# Patient Record
Sex: Female | Born: 1995 | Race: White | Hispanic: No | Marital: Single | Smoking: Current every day smoker
Health system: Southern US, Community
[De-identification: ages and names within clinical notes are randomized; demographics above are authoritative.]

## PROBLEM LIST (undated history)

## (undated) ENCOUNTER — Inpatient Hospital Stay (HOSPITAL_COMMUNITY): Payer: Medicaid Other

## (undated) DIAGNOSIS — F329 Major depressive disorder, single episode, unspecified: Secondary | ICD-10-CM

## (undated) DIAGNOSIS — F909 Attention-deficit hyperactivity disorder, unspecified type: Secondary | ICD-10-CM

## (undated) DIAGNOSIS — F319 Bipolar disorder, unspecified: Secondary | ICD-10-CM

## (undated) DIAGNOSIS — K219 Gastro-esophageal reflux disease without esophagitis: Secondary | ICD-10-CM

## (undated) DIAGNOSIS — J45909 Unspecified asthma, uncomplicated: Secondary | ICD-10-CM

## (undated) DIAGNOSIS — G43909 Migraine, unspecified, not intractable, without status migrainosus: Secondary | ICD-10-CM

## (undated) DIAGNOSIS — F32A Depression, unspecified: Secondary | ICD-10-CM

## (undated) HISTORY — PX: WISDOM TOOTH EXTRACTION: SHX21

## (undated) HISTORY — PX: TONSILLECTOMY: SUR1361

---
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
## 2018-01-27 ENCOUNTER — Emergency Department (HOSPITAL_COMMUNITY)
Admission: EM | Admit: 2018-01-27 | Discharge: 2018-01-28 | Disposition: A | Discharge status: Home / Self Care | Payer: Medicaid Other | Attending: Emergency Medicine | Admitting: Emergency Medicine

## 2018-01-27 ENCOUNTER — Other Ambulatory Visit: Payer: Self-pay

## 2018-01-27 ENCOUNTER — Encounter (HOSPITAL_COMMUNITY): Payer: Self-pay | Admitting: Emergency Medicine

## 2018-01-27 ENCOUNTER — Emergency Department (HOSPITAL_COMMUNITY): Payer: Medicaid Other

## 2018-01-27 DIAGNOSIS — R51 Headache: Secondary | ICD-10-CM | POA: Diagnosis not present

## 2018-01-27 DIAGNOSIS — R0789 Other chest pain: Secondary | ICD-10-CM | POA: Insufficient documentation

## 2018-01-27 DIAGNOSIS — Z5321 Procedure and treatment not carried out due to patient leaving prior to being seen by health care provider: Secondary | ICD-10-CM | POA: Diagnosis not present

## 2018-01-27 DIAGNOSIS — R112 Nausea with vomiting, unspecified: Secondary | ICD-10-CM | POA: Diagnosis not present

## 2018-01-27 DIAGNOSIS — R300 Dysuria: Secondary | ICD-10-CM | POA: Diagnosis not present

## 2018-01-27 DIAGNOSIS — R109 Unspecified abdominal pain: Secondary | ICD-10-CM | POA: Insufficient documentation

## 2018-01-27 HISTORY — DX: Attention-deficit hyperactivity disorder, unspecified type: F90.9

## 2018-01-27 HISTORY — DX: Major depressive disorder, single episode, unspecified: F32.9

## 2018-01-27 HISTORY — DX: Gastro-esophageal reflux disease without esophagitis: K21.9

## 2018-01-27 HISTORY — DX: Depression, unspecified: F32.A

## 2018-01-27 HISTORY — DX: Unspecified asthma, uncomplicated: J45.909

## 2018-01-27 LAB — I-STAT BETA HCG BLOOD, ED (MC, WL, AP ONLY)

## 2018-01-27 LAB — COMPREHENSIVE METABOLIC PANEL
ALK PHOS: 56 U/L (ref 38–126)
ALT: 16 U/L (ref 0–44)
AST: 15 U/L (ref 15–41)
Albumin: 3.9 g/dL (ref 3.5–5.0)
Anion gap: 8 (ref 5–15)
BUN: 9 mg/dL (ref 6–20)
CALCIUM: 8.8 mg/dL — AB (ref 8.9–10.3)
CO2: 23 mmol/L (ref 22–32)
CREATININE: 0.77 mg/dL (ref 0.44–1.00)
Chloride: 107 mmol/L (ref 98–111)
GFR calc non Af Amer: >60 mL/min (ref >60)
GLUCOSE: 90 mg/dL (ref 70–99)
Potassium: 3.8 mmol/L (ref 3.5–5.1)
SODIUM: 138 mmol/L (ref 135–145)
Total Bilirubin: 0.5 mg/dL (ref 0.3–1.2)
Total Protein: 6.5 g/dL (ref 6.5–8.1)

## 2018-01-27 LAB — I-STAT TROPONIN, ED: Troponin i, poc: 0.01 ng/mL (ref 0.00–0.08)

## 2018-01-27 LAB — LIPASE, BLOOD: Lipase: 39 U/L (ref 11–51)

## 2018-01-27 LAB — CBC
HCT: 44.2 % (ref 36.0–46.0)
Hemoglobin: 14.3 g/dL (ref 12.0–15.0)
MCH: 31.4 pg (ref 26.0–34.0)
MCHC: 32.4 g/dL (ref 30.0–36.0)
MCV: 96.9 fL (ref 78.0–100.0)
PLATELETS: 288 10*3/uL (ref 150–400)
RBC: 4.56 MIL/uL (ref 3.87–5.11)
RDW: 13.1 % (ref 11.5–15.5)
WBC: 9.1 10*3/uL (ref 4.0–10.5)

## 2018-01-27 NOTE — ED Triage Notes (Signed)
Pt to ED with multiple complaints.  Chest pain for 2-3 months, Mid abd pain x's 3 days, burning with urination x's 1 week, migraine headache x's 3 days, nausea with vomiting x's 1 day

## 2018-01-28 ENCOUNTER — Emergency Department
Admission: EM | Admit: 2018-01-28 | Discharge: 2018-01-28 | Disposition: A | Discharge status: Home / Self Care | Payer: Medicaid Other | Attending: Emergency Medicine | Admitting: Emergency Medicine

## 2018-01-28 ENCOUNTER — Other Ambulatory Visit: Payer: Self-pay

## 2018-01-28 ENCOUNTER — Encounter: Payer: Self-pay | Admitting: Emergency Medicine

## 2018-01-28 DIAGNOSIS — R197 Diarrhea, unspecified: Secondary | ICD-10-CM | POA: Insufficient documentation

## 2018-01-28 DIAGNOSIS — R112 Nausea with vomiting, unspecified: Secondary | ICD-10-CM | POA: Diagnosis not present

## 2018-01-28 DIAGNOSIS — N898 Other specified noninflammatory disorders of vagina: Secondary | ICD-10-CM | POA: Diagnosis not present

## 2018-01-28 DIAGNOSIS — J45909 Unspecified asthma, uncomplicated: Secondary | ICD-10-CM | POA: Insufficient documentation

## 2018-01-28 DIAGNOSIS — K529 Noninfective gastroenteritis and colitis, unspecified: Secondary | ICD-10-CM

## 2018-01-28 DIAGNOSIS — M545 Low back pain: Secondary | ICD-10-CM | POA: Insufficient documentation

## 2018-01-28 DIAGNOSIS — R109 Unspecified abdominal pain: Secondary | ICD-10-CM | POA: Diagnosis present

## 2018-01-28 DIAGNOSIS — F17210 Nicotine dependence, cigarettes, uncomplicated: Secondary | ICD-10-CM | POA: Insufficient documentation

## 2018-01-28 DIAGNOSIS — R309 Painful micturition, unspecified: Secondary | ICD-10-CM | POA: Insufficient documentation

## 2018-01-28 HISTORY — DX: Bipolar disorder, unspecified: F31.9

## 2018-01-28 HISTORY — DX: Migraine, unspecified, not intractable, without status migrainosus: G43.909

## 2018-01-28 LAB — URINALYSIS, COMPLETE (UACMP) WITH MICROSCOPIC
BILIRUBIN URINE: NEGATIVE
Bacteria, UA: NONE SEEN
GLUCOSE, UA: NEGATIVE mg/dL
Hgb urine dipstick: NEGATIVE
Ketones, ur: NEGATIVE mg/dL
LEUKOCYTES UA: NEGATIVE
Nitrite: NEGATIVE
PH: 7 (ref 5.0–8.0)
Protein, ur: NEGATIVE mg/dL
SPECIFIC GRAVITY, URINE: 1.012 (ref 1.005–1.030)

## 2018-01-28 LAB — COMPREHENSIVE METABOLIC PANEL
ALK PHOS: 62 U/L (ref 38–126)
ALT: 17 U/L (ref 0–44)
AST: 18 U/L (ref 15–41)
Albumin: 4.2 g/dL (ref 3.5–5.0)
Anion gap: 6 (ref 5–15)
BILIRUBIN TOTAL: 0.6 mg/dL (ref 0.3–1.2)
BUN: 9 mg/dL (ref 6–20)
CALCIUM: 9 mg/dL (ref 8.9–10.3)
CHLORIDE: 108 mmol/L (ref 98–111)
CO2: 24 mmol/L (ref 22–32)
CREATININE: 0.75 mg/dL (ref 0.44–1.00)
Glucose, Bld: 115 mg/dL — ABNORMAL HIGH (ref 70–99)
Potassium: 3.8 mmol/L (ref 3.5–5.1)
Sodium: 138 mmol/L (ref 135–145)
TOTAL PROTEIN: 6.8 g/dL (ref 6.5–8.1)

## 2018-01-28 LAB — CBC
HCT: 41.8 % (ref 35.0–47.0)
Hemoglobin: 14.6 g/dL (ref 12.0–16.0)
MCH: 32.9 pg (ref 26.0–34.0)
MCHC: 34.8 g/dL (ref 32.0–36.0)
MCV: 94.4 fL (ref 80.0–100.0)
PLATELETS: 277 10*3/uL (ref 150–440)
RBC: 4.43 MIL/uL (ref 3.80–5.20)
RDW: 13.7 % (ref 11.5–14.5)
WBC: 9.2 10*3/uL (ref 3.6–11.0)

## 2018-01-28 LAB — URINALYSIS, ROUTINE W REFLEX MICROSCOPIC
Bilirubin Urine: NEGATIVE
GLUCOSE, UA: NEGATIVE mg/dL
HGB URINE DIPSTICK: NEGATIVE
Ketones, ur: NEGATIVE mg/dL
NITRITE: NEGATIVE
Protein, ur: NEGATIVE mg/dL
SPECIFIC GRAVITY, URINE: 1.025 (ref 1.005–1.030)
pH: 6 (ref 5.0–8.0)

## 2018-01-28 LAB — POCT PREGNANCY, URINE: Preg Test, Ur: NEGATIVE

## 2018-01-28 LAB — WET PREP, GENITAL
SPERM: NONE SEEN
TRICH WET PREP: NONE SEEN
YEAST WET PREP: NONE SEEN

## 2018-01-28 LAB — LIPASE, BLOOD: LIPASE: 28 U/L (ref 11–51)

## 2018-01-28 MED ORDER — ONDANSETRON 4 MG PO TBDP
4.0000 mg | ORAL_TABLET | Freq: Once | ORAL | Status: AC
  Administered 2018-01-28: 4 mg via ORAL

## 2018-01-28 MED ORDER — ONDANSETRON 4 MG PO TBDP: 4.0000 mg | ORAL_TABLET | Freq: Three times a day (TID) | ORAL | 0 refills | Status: DC | PRN

## 2018-01-28 MED ORDER — IBUPROFEN 400 MG PO TABS
600.0000 mg | ORAL_TABLET | Freq: Once | ORAL | Status: AC
  Administered 2018-01-28: 600 mg via ORAL

## 2018-01-28 MED ORDER — IBUPROFEN 600 MG PO TABS
ORAL_TABLET | ORAL | Status: AC
  Filled 2018-01-28: qty 1

## 2018-01-28 MED ORDER — ONDANSETRON 4 MG PO TBDP
ORAL_TABLET | ORAL | Status: AC
  Filled 2018-01-28: qty 1

## 2018-01-28 NOTE — ED Triage Notes (Addendum)
Pt presents to ED with lower abd pain for the past 3 days. Vomiting X5 and diarrhea. Burning with urination. Pain radiates around to her back.

## 2018-01-28 NOTE — ED Notes (Signed)
Patient stated that she is unable to sit in a room any longer, she has a 23 year old to get to bed.  She stated that she needed to sign herself out.  Patient exited ED at this time.

## 2018-01-28 NOTE — ED Provider Notes (Signed)
[facility] Emergency Department Provider Note  ____________________________________________  Time seen: Approximately 9:19 PM  I have reviewed the triage vital signs and the nursing notes.   HISTORY  Chief Complaint Abdominal Pain   HPI Emilio Aspenshley N Diesel is a 22 y.o. female with a history of bipolar disorder, ADHD, asthma, GERD, migraine headaches who presents for evaluation of abdominal pain vomiting and diarrhea.  Patient reports that the abdominal pain started 3 days ago, sharp and stabbing, constant, currently 6 out of 10.  She reports chronic lower back pain however that has been worse since the abdominal pain started.  Patient reports that yesterday she started to have vomiting and diarrhea.  She reports 5 episodes of watery diarrhea day.  She reports vomiting every time she eats.  She has had chills but no fever, she reports burning with urination and a vaginal discharge which according to her "it is her normal".  She is requesting to be checked for STDs.  She denies any prior abdominal surgeries.  No melena, hematemesis, hematochezia.  Past Medical History:  Diagnosis Date  . ADHD   . Asthma   . Bipolar disorder (HCC)   . Depression   . GERD (gastroesophageal reflux disease)   . Migraine     Past Surgical History:  Procedure Laterality Date  . TONSILLECTOMY    . WISDOM TOOTH EXTRACTION      Prior to Admission medications   Medication Sig Start Date End Date Taking? Authorizing Provider  ondansetron (ZOFRAN ODT) 4 MG disintegrating tablet Take 1 tablet (4 mg total) by mouth every 8 (eight) hours as needed for nausea or vomiting. 01/28/18   Nita SickleVeronese, [place], MD    Allergies Fluconazole  FH Bipolar disorder Father    Schizophrenia Father    Bipolar disorder Maternal Grandmother    Hypertension Maternal Grandmother    Bipolar disorder Mother    Diabetes Mother    Seizures Mother    Diabetes Paternal Grandmother      Hypertension Paternal Grandmother    Bipolar disorder Sister    Asthma Son      Social History Social History   Tobacco Use  . Smoking status: Current Every Day Smoker    Packs/day: 1.00    Types: Cigarettes  . Smokeless tobacco: Never Used  Substance Use Topics  . Alcohol use: Never    Frequency: Never  . Drug use: Never    Review of Systems  Constitutional: Negative for fever. + chills Eyes: Negative for visual changes. ENT: Negative for sore throat. Neck: No neck pain  Cardiovascular: Negative for chest pain. Respiratory: Negative for shortness of breath. Gastrointestinal: + abdominal pain, vomiting and diarrhea. Genitourinary: + dysuria and vaginal discharge Musculoskeletal: Negative for back pain. Skin: Negative for rash. Neurological: Negative for headaches, weakness or numbness. Psych: No SI or HI  ____________________________________________   PHYSICAL EXAM:  VITAL SIGNS: ED Triage Vitals  Enc Vitals Group     BP 01/28/18 1948 124/68     Pulse Rate 01/28/18 1948 85     Resp 01/28/18 1948 18     Temp 01/28/18 1948 98.3 F (36.8 C)     Temp Source 01/28/18 1948 Oral     SpO2 01/28/18 1948 100 %     Weight 01/28/18 1949 235 lb (106.6 kg)     Height 01/28/18 1949 5\' 4"  (1.626 m)     Head Circumference --      Peak Flow --      Pain  Score 01/28/18 1948 9     Pain Loc --      Pain Edu? --      Excl. in GC? --     Constitutional: Alert and oriented. Well appearing and in no apparent distress. HEENT:      Head: Normocephalic and atraumatic.         Eyes: Conjunctivae are normal. Sclera is non-icteric.       Mouth/Throat: Mucous membranes are moist.       Neck: Supple with no signs of meningismus. Cardiovascular: Regular rate and rhythm. No murmurs, gallops, or rubs. 2+ symmetrical distal pulses are present in all extremities. No JVD. Respiratory: Normal respiratory effort. Lungs are clear to auscultation bilaterally. No wheezes, crackles, or  rhonchi.  Gastrointestinal: Soft, mild diffuse tenderness to palpation, and non distended with positive bowel sounds. No rebound or guarding. Pelvic exam: Normal external genitalia, no rashes or lesions. Normal cervical mucus. Os closed. No cervical motion tenderness.  No uterine or adnexal tenderness.   Musculoskeletal: Nontender with normal range of motion in all extremities. No edema, cyanosis, or erythema of extremities. Neurologic: Normal speech and language. Face is symmetric. Moving all extremities. No gross focal neurologic deficits are appreciated. Skin: Skin is warm, dry and intact. No rash noted. Psychiatric: Mood and affect are normal. Speech and behavior are normal.  ____________________________________________   LABS (all labs ordered are listed, but only abnormal results are displayed)  Labs Reviewed  COMPREHENSIVE METABOLIC PANEL - Abnormal; Notable for the following components:      Result Value   Glucose, Bld 115 (*)    All other components within normal limits  URINALYSIS, COMPLETE (UACMP) WITH MICROSCOPIC - Abnormal; Notable for the following components:   Color, Urine YELLOW (*)    APPearance HAZY (*)    All other components within normal limits  URINE CULTURE  WET PREP, GENITAL  CHLAMYDIA/NGC RT PCR ([facility] ONLY)  LIPASE, BLOOD  CBC  POC URINE PREG, ED  POCT PREGNANCY, URINE   ____________________________________________  EKG  none  ____________________________________________  RADIOLOGY  none  ____________________________________________   PROCEDURES  Procedure(s) performed: None Procedures Critical Care performed:  None ____________________________________________   INITIAL IMPRESSION / ASSESSMENT AND PLAN / ED COURSE   22 y.o. female with a history of bipolar disorder, ADHD, asthma, GERD, migraine headaches who presents for evaluation of abdominal pain, vomiting and diarrhea.  Patient is well-appearing, no distress, she has normal  vital signs, she looks well-hydrated, abdomen is soft with mild tenderness throughout, no localized tenderness, no guarding or rebound.  Labs including CBC, CMP, lipase, pregnancy test, and urinalysis are all within normal limits.  Presentation concerning for viral gastroenteritis.  Will give ibuprofen and Zofran.  Offered IV fluids but patient has refused.  At this time patient has no signs of dehydration or lab abnormalities.  Patient is requesting to be checked for STDs.  Will perform a pelvic exam and send gonorrhea, chlamydia, and wet prep.    _________________________ 10:23 PM on 01/28/2018 -----------------------------------------  Pelvic exam showing small amount of whitish discharge with no CMT.  Patient is tolerating p.o.  She would like to go home since her child is here with her.  I will call her with the results of wet prep, GC and chlamydia.  She will be discharged home with Zofran and follow-up with primary care doctor.  Discussed return precautions for new or worsening abdominal pain, signs of dehydration, or fever.   As part of my medical decision making,  I reviewed the following data within the electronic MEDICAL RECORD NUMBER Nursing notes reviewed and incorporated, Labs reviewed , Old chart reviewed, Notes from prior ED visits and [place] Controlled Substance Database    Pertinent labs & imaging results that were available during my care of the patient were reviewed by me and considered in my medical decision making (see chart for details).    ____________________________________________   FINAL CLINICAL IMPRESSION(S) / ED DIAGNOSES  Final diagnoses:  Nausea vomiting and diarrhea  Gastroenteritis  Vaginal discharge      NEW MEDICATIONS STARTED DURING THIS VISIT:  ED Discharge Orders         Ordered    ondansetron (ZOFRAN ODT) 4 MG disintegrating tablet  Every 8 hours PRN     01/28/18 2221           Note:  This document was prepared using Dragon voice recognition  software and may include unintentional dictation errors.    Nita SickleVeronese, [place], MD 01/28/18 2224

## 2018-01-28 NOTE — ED Triage Notes (Signed)
First nurse note: Patient to ER for lower abd pain bilaterally, as well as back pain "all the way across".

## 2018-01-28 NOTE — Discharge Instructions (Addendum)

## 2018-01-28 NOTE — ED Notes (Addendum)
CBC and [facility]. Awaiting CMP from today.

## 2018-01-29 LAB — CHLAMYDIA/NGC RT PCR (ARMC ONLY)
CHLAMYDIA TR: NOT DETECTED
N gonorrhoeae: NOT DETECTED

## 2018-01-31 LAB — URINE CULTURE: Culture: >=100000 — AB

## 2018-02-03 NOTE — Progress Notes (Signed)
Pharmacy Antibiotic Note  Lisa Preston is a 22 y.o. female treated and released from the ED on 01/28/2018 with abdominal pain.  Pharmacy has been consulted for ciprofloxacin dosing. She was given only Zofran and no antibiotics on discharge but her urine grew out E cloacae resistant to Ancef only and intermediate to nitrofurantoin. I spoke with Lisa Preston and she continues to have flank pain and also pain on urination. I discussed this patient with Lisa Preston.  Plan: Ciprofloxacin 500mg  twice daily for 3 days. [facility] [address] [phone][facility] and discussed instructions with Lisa Preston who voiced understanding and promises to complete therapy   Height: 5\' 4"  (162.6 cm) Weight: 235 lb (106.6 kg) IBW/kg (Calculated) : 54.7  No data recorded.  Recent Labs  Lab 01/27/18 2206 01/28/18 1950  WBC 9.1 9.2  CREATININE 0.77 0.75    Estimated Creatinine Clearance: 130.4 mL/min (by C-G formula based on SCr of 0.75 mg/dL).    Allergies  Allergen Reactions  . Fluconazole Rash    Thank you for allowing pharmacy to be a part of this patient's care.  Lowella Bandyodney D Josanne Boerema, PharmD 02/03/2018 3:32 PM

## 2018-02-19 ENCOUNTER — Other Ambulatory Visit: Payer: Self-pay

## 2018-02-19 ENCOUNTER — Encounter: Payer: Self-pay | Admitting: Emergency Medicine

## 2018-02-19 ENCOUNTER — Emergency Department
Admission: EM | Admit: 2018-02-19 | Discharge: 2018-02-19 | Disposition: A | Discharge status: Home / Self Care | Payer: Medicaid Other | Attending: Emergency Medicine | Admitting: Emergency Medicine

## 2018-02-19 DIAGNOSIS — F17210 Nicotine dependence, cigarettes, uncomplicated: Secondary | ICD-10-CM | POA: Insufficient documentation

## 2018-02-19 DIAGNOSIS — N61 Mastitis without abscess: Secondary | ICD-10-CM | POA: Insufficient documentation

## 2018-02-19 DIAGNOSIS — R21 Rash and other nonspecific skin eruption: Secondary | ICD-10-CM | POA: Diagnosis present

## 2018-02-19 DIAGNOSIS — J45909 Unspecified asthma, uncomplicated: Secondary | ICD-10-CM | POA: Diagnosis not present

## 2018-02-19 MED ORDER — CLINDAMYCIN HCL 300 MG PO CAPS: 300.0000 mg | ORAL_CAPSULE | Freq: Three times a day (TID) | ORAL | 0 refills | Status: AC

## 2018-02-19 MED ORDER — CLINDAMYCIN HCL 150 MG PO CAPS
300.0000 mg | ORAL_CAPSULE | Freq: Once | ORAL | Status: AC
  Administered 2018-02-19: 300 mg via ORAL
  Filled 2018-02-19: qty 2

## 2018-02-19 NOTE — ED Notes (Signed)
Site on left breast marked with skin marker.

## 2018-02-19 NOTE — ED Notes (Signed)
Pt has insect bit to left medial breast, noticed it yesterday, large reddened area with dark spot in the middle. Pt reports painful to touch.

## 2018-02-19 NOTE — ED Triage Notes (Signed)
FIRST NURSE NOTE-pt thinks a spider bit her as she was walking through woods and hit a [employer] and exercise psychologist. Popped an area on breast and now red. NAD

## 2018-02-19 NOTE — ED Triage Notes (Signed)
Pt reports that she was walking in the woods and was bit by something, she seen a pimple and popped it, now left breast is red and swollen. She reports that it does itch.

## 2018-02-19 NOTE — ED Provider Notes (Signed)
[facility] Emergency Department Provider Note  ____________________________________________  Time seen: Approximately 7:42 PM  I have reviewed the triage vital signs and the nursing notes.   HISTORY  Chief Complaint Insect Bite    HPI Lisa Preston is a 22 y.o. female presenting to the emergency department with 5 cm of circumferential cellulitis along medial aspect of left breast.  Patient reports that she had what appeared to be a pimple that was self expressed and redness acutely worsened.  She denies fever and chills.  No prior history of breast abscesses.  Patient denies any discharge from the left nipple.  No alleviating measures have been attempted.   Past Medical History:  Diagnosis Date  . ADHD   . Asthma   . Bipolar disorder (HCC)   . Depression   . GERD (gastroesophageal reflux disease)   . Migraine     There are no active problems to display for this patient.   Past Surgical History:  Procedure Laterality Date  . TONSILLECTOMY    . WISDOM TOOTH EXTRACTION      Prior to Admission medications   Medication Sig Start Date End Date Taking? Authorizing Provider  clindamycin (CLEOCIN) 300 MG capsule Take 1 capsule (300 mg total) by mouth 3 (three) times daily for 10 days. 02/19/18 03/01/18  Orvil Feil, PA-C  ondansetron (ZOFRAN ODT) 4 MG disintegrating tablet Take 1 tablet (4 mg total) by mouth every 8 (eight) hours as needed for nausea or vomiting. 01/28/18   Nita Sickle, MD    Allergies Fluconazole  History reviewed. No pertinent family history.  Social History Social History   Tobacco Use  . Smoking status: Current Every Day Smoker    Packs/day: 1.00    Types: Cigarettes  . Smokeless tobacco: Never Used  Substance Use Topics  . Alcohol use: Never    Frequency: Never  . Drug use: Never     Review of Systems  Constitutional: No fever/chills Eyes: No visual changes. No discharge ENT: No upper respiratory  complaints. Cardiovascular: no chest pain. Respiratory: no cough. No SOB. Gastrointestinal: No abdominal pain.  No nausea, no vomiting.  No diarrhea.  No constipation. Musculoskeletal: Negative for musculoskeletal pain. Skin: Patient has left breast cellulitis.  Neurological: Negative for headaches, focal weakness or numbness.   ____________________________________________   PHYSICAL EXAM:  VITAL SIGNS: ED Triage Vitals  Enc Vitals Group     BP 02/19/18 1741 120/63     Pulse Rate 02/19/18 1741 (!) 111     Resp 02/19/18 1939 18     Temp 02/19/18 1741 98.3 F (36.8 C)     Temp Source 02/19/18 1741 Oral     SpO2 02/19/18 1741 97 %     Weight 02/19/18 1742 235 lb (106.6 kg)     Height 02/19/18 1742 5\' 4"  (1.626 m)     Head Circumference --      Peak Flow --      Pain Score 02/19/18 1755 2     Pain Loc --      Pain Edu? --      Excl. in GC? --      Constitutional: Alert and oriented. Well appearing and in no acute distress. Eyes: Conjunctivae are normal. PERRL. EOMI. Head: Atraumatic. Cardiovascular: Normal rate, regular rhythm. Normal S1 and S2.  Good peripheral circulation. Respiratory: Normal respiratory effort without tachypnea or retractions. Lungs CTAB. Good air entry to the bases with no decreased or absent breath sounds. Musculoskeletal: Full range  of motion to all extremities. No gross deformities appreciated. Neurologic:  Normal speech and language. No gross focal neurologic deficits are appreciated.  Skin: Patient has 5 cm of circumferential cellulitis along left breast without palpable induration or fluctuance. Psychiatric: Mood and affect are normal. Speech and behavior are normal. Patient exhibits appropriate insight and judgement.   ____________________________________________   LABS (all labs ordered are listed, but only abnormal results are displayed)  Labs Reviewed - No data to  display ____________________________________________  EKG   ____________________________________________  RADIOLOGY   No results found.  ____________________________________________    PROCEDURES  Procedure(s) performed:    Procedures    Medications  clindamycin (CLEOCIN) capsule 300 mg (has no administration in time range)     ____________________________________________   INITIAL IMPRESSION / ASSESSMENT AND PLAN / ED COURSE  Pertinent labs & imaging results that were available during my care of the patient were reviewed by me and considered in my medical decision making (see chart for details).  Review of the  [place] was performed in accordance of the [place] prior to dispensing any controlled drugs.      Assessment and plan Left breast cellulitis Patient presents to the emergency department with 5 cm of circumferential cellulitis along the left breast.  No fluctuance or induration was appreciated on physical exam.  Region of cellulitis was demarcated with disposable pen in the emergency department.  Patient was advised to return to the emergency department if cellulitis extends past demarcation.  Patient was treated empirically with clindamycin.  She was given her first dose of clindamycin in the emergency department tonight.  Vital signs are reassuring prior to discharge.  All patient questions were answered.    ____________________________________________  FINAL CLINICAL IMPRESSION(S) / ED DIAGNOSES  Final diagnoses:  Cellulitis of breast      NEW MEDICATIONS STARTED DURING THIS VISIT:  ED Discharge Orders         Ordered    clindamycin (CLEOCIN) 300 MG capsule  3 times daily     02/19/18 1934              This chart was dictated using voice recognition software/Dragon. Despite best efforts to proofread, errors can occur which can change the meaning. Any change was purely unintentional.    Orvil Feil, PA-C 02/19/18 1947     Myrna Blazer, MD 02/19/18 [phone]

## 2018-11-28 ENCOUNTER — Encounter (HOSPITAL_COMMUNITY): Payer: Self-pay | Admitting: *Deleted

## 2018-11-28 ENCOUNTER — Inpatient Hospital Stay (HOSPITAL_COMMUNITY)
Admission: AD | Admit: 2018-11-28 | Discharge: 2018-11-29 | Disposition: A | Discharge status: Home / Self Care | Payer: Medicaid Other | Attending: Obstetrics and Gynecology | Admitting: Obstetrics and Gynecology

## 2018-11-28 ENCOUNTER — Inpatient Hospital Stay (HOSPITAL_COMMUNITY): Payer: Medicaid Other

## 2018-11-28 ENCOUNTER — Other Ambulatory Visit: Payer: Self-pay

## 2018-11-28 DIAGNOSIS — O209 Hemorrhage in early pregnancy, unspecified: Secondary | ICD-10-CM | POA: Insufficient documentation

## 2018-11-28 DIAGNOSIS — R109 Unspecified abdominal pain: Secondary | ICD-10-CM | POA: Insufficient documentation

## 2018-11-28 DIAGNOSIS — Z3A01 Less than 8 weeks gestation of pregnancy: Secondary | ICD-10-CM | POA: Insufficient documentation

## 2018-11-28 DIAGNOSIS — O99331 Smoking (tobacco) complicating pregnancy, first trimester: Secondary | ICD-10-CM | POA: Diagnosis not present

## 2018-11-28 DIAGNOSIS — F17210 Nicotine dependence, cigarettes, uncomplicated: Secondary | ICD-10-CM | POA: Insufficient documentation

## 2018-11-28 DIAGNOSIS — O26891 Other specified pregnancy related conditions, first trimester: Secondary | ICD-10-CM

## 2018-11-28 DIAGNOSIS — O09291 Supervision of pregnancy with other poor reproductive or obstetric history, first trimester: Secondary | ICD-10-CM

## 2018-11-28 LAB — POCT PREGNANCY, URINE: Preg Test, Ur: POSITIVE — AB

## 2018-11-28 NOTE — MAU Note (Signed)
Pt reports to MAU from [place] stating she is [redacted]wk pregnant and is having some vaginal bleeding when she wipes. Pt states the bleeding is Tandy and she put on a pad and has not had to change it. LMP was May 4th 2020. Patient reports lower abdominal pain that is a 4/10 on the pain scale.

## 2018-11-29 LAB — URINALYSIS, ROUTINE W REFLEX MICROSCOPIC
Bilirubin Urine: NEGATIVE
Glucose, UA: NEGATIVE mg/dL
Ketones, ur: NEGATIVE mg/dL
Leukocytes,Ua: NEGATIVE
Nitrite: NEGATIVE
Protein, ur: NEGATIVE mg/dL
Specific Gravity, Urine: 1.026 (ref 1.005–1.030)
pH: 5 (ref 5.0–8.0)

## 2018-11-29 LAB — ABO/RH: ABO/RH(D): A POS

## 2018-11-29 LAB — CBC
HCT: 41.3 % (ref 36.0–46.0)
Hemoglobin: 13.9 g/dL (ref 12.0–15.0)
MCH: 31.8 pg (ref 26.0–34.0)
MCHC: 33.7 g/dL (ref 30.0–36.0)
MCV: 94.5 fL (ref 80.0–100.0)
Platelets: 317 10*3/uL (ref 150–400)
RBC: 4.37 MIL/uL (ref 3.87–5.11)
RDW: 12.7 % (ref 11.5–15.5)
WBC: 8.9 10*3/uL (ref 4.0–10.5)
nRBC: 0 % (ref 0.0–0.2)

## 2018-11-29 LAB — WET PREP, GENITAL
Sperm: NONE SEEN
Trich, Wet Prep: NONE SEEN
Yeast Wet Prep HPF POC: NONE SEEN

## 2018-11-29 LAB — HCG, QUANTITATIVE, PREGNANCY: hCG, Beta Chain, Quant, S: 9060 m[IU]/mL — ABNORMAL HIGH (ref <5)

## 2018-11-29 NOTE — MAU Provider Note (Signed)
History     CSN: [id number]  Arrival date and time: 11/28/18 2231   None     Chief Complaint  Patient presents with  . Vaginal Bleeding   HPI   Ms.Emilio Aspen.Alahia N Seelinger is a 23 y.o. female [id number] @ Unknown here in MAU with vaginal spotting that she first noticed today after wiping.  She never saw blood on her pad or under ware, only when she wiped. Mild pain in her lower abdomen that she rates 4/10. She has not taken any medication for the pain.  No recent intercourse.   OB History    Gravida  11   Para      Term      Preterm      AB  10   Living  1     SAB  10   TAB      Ectopic      Multiple      Live Births  1           Past Medical History:  Diagnosis Date  . ADHD   . Asthma   . Bipolar disorder (HCC)   . Depression   . GERD (gastroesophageal reflux disease)   . Migraine     Past Surgical History:  Procedure Laterality Date  . TONSILLECTOMY    . WISDOM TOOTH EXTRACTION      History reviewed. No pertinent family history.  Social History   Tobacco Use  . Smoking status: Current Every Day Smoker    Packs/day: 0.50    Types: Cigarettes  . Smokeless tobacco: Never Used  Substance Use Topics  . Alcohol use: Never    Frequency: Never  . Drug use: Never    Allergies:  Allergies  Allergen Reactions  . Fluconazole Rash    Medications Prior to Admission  Medication Sig Dispense Refill Last Dose  . Prenatal Vit-Fe Fumarate-FA (MULTIVITAMIN-PRENATAL) 27-0.8 MG TABS tablet Take 1 tablet by mouth daily at 12 noon.   11/28/2018 at Unknown time  . ondansetron (ZOFRAN ODT) 4 MG disintegrating tablet Take 1 tablet (4 mg total) by mouth every 8 (eight) hours as needed for nausea or vomiting. 20 tablet 0    Results for orders placed or performed during the hospital encounter of 11/28/18 (from the past 48 hour(s))  Pregnancy, urine POC     Status: Abnormal   Collection Time: 11/28/18 11:03 PM  Result Value Ref Range   Preg Test, Ur POSITIVE (A)  NEGATIVE    Comment:        THE SENSITIVITY OF THIS METHODOLOGY IS >24 mIU/mL   Urinalysis, Routine w reflex microscopic     Status: Abnormal   Collection Time: 11/28/18 11:05 PM  Result Value Ref Range   Color, Urine YELLOW YELLOW   APPearance CLEAR CLEAR   Specific Gravity, Urine 1.026 1.005 - 1.030   pH 5.0 5.0 - 8.0   Glucose, UA NEGATIVE NEGATIVE mg/dL   Hgb urine dipstick SMALL (A) NEGATIVE   Bilirubin Urine NEGATIVE NEGATIVE   Ketones, ur NEGATIVE NEGATIVE mg/dL   Protein, ur NEGATIVE NEGATIVE mg/dL   Nitrite NEGATIVE NEGATIVE   Leukocytes,Ua NEGATIVE NEGATIVE   WBC, UA 0-5 0 - 5 WBC/hpf   Bacteria, UA MANY (A) NONE SEEN   Squamous Epithelial / LPF 0-5 0 - 5   Mucus PRESENT     Comment: Performed at [facility], [address] [place], [place] [address]  hCG, quantitative, pregnancy     Status:  Abnormal   Collection Time: 11/28/18 11:09 PM  Result Value Ref Range   hCG, Beta Chain, Quant, S 9,060 (H) <5 mIU/mL    Comment:          GEST. AGE      CONC.  (mIU/mL)   <=1 WEEK        5 - 50     2 WEEKS       50 - 500     3 WEEKS       100 - 10,000     4 WEEKS     1,000 - 30,000     5 WEEKS     3,500 - 115,000   6-8 WEEKS     12,000 - 270,000    12 WEEKS     15,000 - 220,000        FEMALE AND NON-PREGNANT FEMALE:     LESS THAN 5 mIU/mL Performed at [facility], [address] [place], [place] [address]   ABO/Rh     Status: None   Collection Time: 11/28/18 11:12 PM  Result Value Ref Range   ABO/RH(D)      A POS Performed at [facility], [address] [place], [place] [address]   CBC     Status: None   Collection Time: 11/28/18 11:12 PM  Result Value Ref Range   WBC 8.9 4.0 - 10.5 K/uL   RBC 4.37 3.87 - 5.11 MIL/uL   Hemoglobin 13.9 12.0 - 15.0 g/dL   HCT 09.841.3 11.936.0 - 14.746.0 %   MCV 94.5 80.0 - 100.0 fL   MCH 31.8 26.0 - 34.0 pg   MCHC 33.7 30.0 - 36.0 g/dL   RDW 82.912.7 56.211.5 - 13.015.5 %   Platelets 317 150 - 400 K/uL   nRBC 0.0 0.0 - 0.2 %     Comment: Performed at [facility], [address] [place], [place] [address]  Wet prep, genital     Status: Abnormal   Collection Time: 11/29/18 12:11 AM  Result Value Ref Range   Yeast Wet Prep HPF POC NONE SEEN NONE SEEN   Trich, Wet Prep NONE SEEN NONE SEEN   Clue Cells Wet Prep HPF POC PRESENT (A) NONE SEEN   WBC, Wet Prep HPF POC MODERATE (A) NONE SEEN   Sperm NONE SEEN     Comment: Performed at [facility], [address] [place], [place] [address]   [place] Ob Less Than 14 Weeks With Ob Transvaginal  Result Date: 11/29/2018 CLINICAL DATA:  Bleeding, pain EXAM: OBSTETRIC <14 WK US AND TRANSVAGINAL OB US TECHNIQUE: Both transabdominal and transvaginal ultrasound examinations were performed for complete evaluation of the gestation as well as the maternal uterus, adnexal regions, and pelvic cul-de-sac. Transvaginal technique was performed to assess early pregnancy. COMPARISON:  None. FINDINGS: Intrauterine gestational sac: Single Yolk sac:  Visualized Embryo:  Not visualized Cardiac Activity: Not visualized Heart Rate:   bpm MSD: 6.9 mm   5 w   2 d CRL:    mm    w    d                  US EDC: Subchorionic hemorrhage:  None visualized. Maternal uterus/adnexae: No adnexal mass or free fluid. IMPRESSION: Early intrauterine gestational sac, 5 weeks 2 days by mean sac diameter. Yolk sac is visualized but no fetal pole currently. No acute maternal findings. This could be followed with repeat ultrasound  in 14 days to ensure expected progression as clinically indicated. Electronically Signed   By: Rolm Baptise M.D.   On: 11/29/2018 00:04   Review of Systems  Constitutional: Negative for fever.  Gastrointestinal: Positive for abdominal pain.  Genitourinary: Positive for vaginal bleeding.   Physical Exam   Blood pressure 126/67, pulse 92, temperature 98.7 F (37.1 C), temperature source Oral, resp. rate 18, weight 120.6 kg, last menstrual period 10/19/2018.  Physical Exam   Constitutional: She is oriented to person, place, and time. She appears well-developed and well-nourished. No distress.  HENT:  Head: Normocephalic.  Eyes: Pupils are equal, round, and reactive to light.  Respiratory: Effort normal.  GI: Soft. She exhibits no distension. There is no abdominal tenderness. There is no rebound.  Genitourinary:    Genitourinary Comments: Wet prep and GC collected without speculum Cervix: closed, thick, posterior. No blood noted on exam glove.    Musculoskeletal: Normal range of motion.  Neurological: She is alert and oriented to person, place, and time.  Skin: Skin is warm. She is not diaphoretic.  Psychiatric: Her behavior is normal.    MAU Course  Procedures  None  MDM  A positive Blood type  Wet prep & GC HIV, CBC, Hcg, ABO US OB transvaginal  Clue cells on wet prep however patient without symptoms of BV.  Assessment and Plan   A:  SIUP @ 5w  1. History of miscarriage, currently pregnant, first trimester   2. Vaginal bleeding affecting early pregnancy   3. [redacted] weeks gestation of pregnancy   4. Abdominal pain in pregnancy, first trimester     P:  Discharge home in stable condition Start prenatal care.  Prenatal vitamins daily Return to MAU if symptoms worsen Pelvic rest Bleeding precautions  Laasia Arcos, Artist Pais, NP 11/29/2018 12:57 AM

## 2018-11-29 NOTE — Discharge Instructions (Signed)
Vaginal Bleeding During Pregnancy, First Trimester    A small amount of bleeding (spotting) from the vagina is common during early pregnancy. Sometimes the bleeding is normal and does not cause problems. At other times, though, bleeding may be a sign of something serious. Tell your doctor about any bleeding from your vagina right away.  Follow these instructions at home:  Activity  · Follow your doctor's instructions about how active you can be.  · If needed, make plans for someone to help with your normal activities.  · Do not have sex or orgasms until your doctor says that this is safe.  General instructions  · Take over-the-counter and prescription medicines only as told by your doctor.  · Watch your condition for any changes.  · Write down:  ? The number of pads you use each day.  ? How often you change pads.  ? How soaked (saturated) your pads are.  · Do not use tampons.  · Do not douche.  · If you pass any tissue from your vagina, save it to show to your doctor.  · Keep all follow-up visits as told by your doctor. This is important.  Contact a doctor if:  · You have vaginal bleeding at any time while you are pregnant.  · You have cramps.  · You have a fever.  Get help right away if:  · You have very bad cramps in your back or belly (abdomen).  · You pass large clots or a lot of tissue from your vagina.  · Your bleeding gets worse.  · You feel light-headed.  · You feel weak.  · You pass out (faint).  · You have chills.  · You are leaking fluid from your vagina.  · You have a gush of fluid from your vagina.  Summary  · Sometimes vaginal bleeding during pregnancy is normal and does not cause problems. At other times, bleeding may be a sign of something serious.  · Tell your doctor about any bleeding from your vagina right away.  · Follow your doctor's instructions about how active you can be. You may need someone to help you with your normal activities.  This information is not intended to replace advice given to  you by your health care provider. Make sure you discuss any questions you have with your health care provider.  Document Released: 10/18/2013 Document Revised: 09/04/2016 Document Reviewed: 09/04/2016  Elsevier Interactive Patient Education © 2019 [employer]

## 2018-11-30 LAB — GC/CHLAMYDIA PROBE AMP (~~LOC~~) NOT AT ARMC
Chlamydia: NEGATIVE
Neisseria Gonorrhea: NEGATIVE

## 2018-12-06 ENCOUNTER — Other Ambulatory Visit: Payer: Self-pay

## 2018-12-06 ENCOUNTER — Encounter (HOSPITAL_COMMUNITY): Payer: Self-pay

## 2018-12-06 ENCOUNTER — Inpatient Hospital Stay (HOSPITAL_COMMUNITY)
Admission: AD | Admit: 2018-12-06 | Discharge: 2018-12-07 | Disposition: A | Discharge status: Home / Self Care | Payer: Medicaid Other | Attending: Obstetrics & Gynecology | Admitting: Obstetrics & Gynecology

## 2018-12-06 DIAGNOSIS — Z883 Allergy status to other anti-infective agents status: Secondary | ICD-10-CM | POA: Insufficient documentation

## 2018-12-06 DIAGNOSIS — N760 Acute vaginitis: Secondary | ICD-10-CM

## 2018-12-06 DIAGNOSIS — O98813 Other maternal infectious and parasitic diseases complicating pregnancy, third trimester: Secondary | ICD-10-CM | POA: Diagnosis not present

## 2018-12-06 DIAGNOSIS — O418X30 Other specified disorders of amniotic fluid and membranes, third trimester, not applicable or unspecified: Secondary | ICD-10-CM | POA: Diagnosis not present

## 2018-12-06 DIAGNOSIS — F17210 Nicotine dependence, cigarettes, uncomplicated: Secondary | ICD-10-CM | POA: Diagnosis not present

## 2018-12-06 DIAGNOSIS — O99331 Smoking (tobacco) complicating pregnancy, first trimester: Secondary | ICD-10-CM | POA: Insufficient documentation

## 2018-12-06 DIAGNOSIS — O26891 Other specified pregnancy related conditions, first trimester: Secondary | ICD-10-CM | POA: Insufficient documentation

## 2018-12-06 DIAGNOSIS — Z3A01 Less than 8 weeks gestation of pregnancy: Secondary | ICD-10-CM | POA: Diagnosis not present

## 2018-12-06 DIAGNOSIS — O23591 Infection of other part of genital tract in pregnancy, first trimester: Secondary | ICD-10-CM | POA: Diagnosis not present

## 2018-12-06 DIAGNOSIS — B9689 Other specified bacterial agents as the cause of diseases classified elsewhere: Secondary | ICD-10-CM | POA: Diagnosis not present

## 2018-12-06 DIAGNOSIS — O468X1 Other antepartum hemorrhage, first trimester: Secondary | ICD-10-CM

## 2018-12-06 DIAGNOSIS — N939 Abnormal uterine and vaginal bleeding, unspecified: Secondary | ICD-10-CM | POA: Diagnosis present

## 2018-12-06 DIAGNOSIS — O418X10 Other specified disorders of amniotic fluid and membranes, first trimester, not applicable or unspecified: Secondary | ICD-10-CM

## 2018-12-06 NOTE — MAU Provider Note (Signed)
History     CSN: [id number]  Arrival date and time: 12/06/18 2248   First Provider Initiated Contact with Patient 12/06/18 2335      Chief Complaint  Patient presents with  . Vaginal Bleeding   Emilio Aspenshley N Weaver is a 23 y.o. [id number](10)1 at Unknown who has not established PNC.  She presents today for Vaginal Bleeding.  She states the bleeding has been "off and on" since her last visit on June 14th.  She reports that it was initially [place] in color, but is now bright red.  She states that she has also passed some small clots and has some cramping. She reports cramping in her lower abdomen and rates it a 3-4/10.  She denies sexual activity in the last 3 days stating she was advised to maintain pelvic rest until after the first trimester.      OB History    Gravida  11   Para      Term      Preterm      AB  10   Living  1     SAB  10   TAB      Ectopic      Multiple      Live Births  1           Past Medical History:  Diagnosis Date  . ADHD   . Asthma   . Bipolar disorder (HCC)   . Depression   . GERD (gastroesophageal reflux disease)   . Migraine     Past Surgical History:  Procedure Laterality Date  . TONSILLECTOMY    . WISDOM TOOTH EXTRACTION      History reviewed. No pertinent family history.  Social History   Tobacco Use  . Smoking status: Current Every Day Smoker    Packs/day: 0.50    Types: Cigarettes  . Smokeless tobacco: Never Used  Substance Use Topics  . Alcohol use: Never    Frequency: Never  . Drug use: Never    Allergies:  Allergies  Allergen Reactions  . Fluconazole Rash    Medications Prior to Admission  Medication Sig Dispense Refill Last Dose  . Prenatal Vit-Fe Fumarate-FA (MULTIVITAMIN-PRENATAL) 27-0.8 MG TABS tablet Take 1 tablet by mouth daily at 12 noon.   12/06/2018 at Unknown time    Review of Systems  Constitutional: Negative for chills and fever.  Respiratory: Negative for cough and shortness of breath.    Gastrointestinal: Positive for abdominal pain. Negative for constipation, diarrhea, nausea and vomiting.  Genitourinary: Positive for vaginal bleeding. Negative for difficulty urinating, dysuria and vaginal discharge.  Neurological: Negative for dizziness, light-headedness and headaches.   Physical Exam   Blood pressure (!) 117/55, pulse 93, temperature 98.1 F (36.7 C), temperature source Oral, resp. rate 20, height 5\' 5"  (1.651 m), weight 120.9 kg, SpO2 99 %.  Physical Exam  Constitutional: She is oriented to person, place, and time. She appears well-developed and well-nourished.  HENT:  Head: Normocephalic and atraumatic.  Eyes: Conjunctivae are normal.  Neck: Normal range of motion.  Cardiovascular: Normal rate.  Respiratory: Effort normal.  GI: Soft.  Genitourinary: Cervix exhibits no motion tenderness and no discharge.    Vaginal discharge present.     Genitourinary Comments: Speculum Exam: -Piercing noted at clitoral hood-No inflammation or redness -Vaginal Vault: Pink mucosa.  Small amt dark [place] discharge -wet prep collected -Cervix:Pink, no lesions, cysts, or polyps.  Appears closed. No active bleeding or discharge  from os -Bimanual Exam: Closed  Uterus size and position difficult to assess s/t body habitus.    Musculoskeletal: Normal range of motion.  Neurological: She is alert and oriented to person, place, and time.  Skin: Skin is warm and dry.  Psychiatric: She has a normal mood and affect. Her behavior is normal.    MAU Course  Procedures Results for orders placed or performed during the hospital encounter of 12/06/18 (from the past 24 hour(s))  Wet prep, genital     Status: Abnormal   Collection Time: 12/06/18 11:47 PM  Result Value Ref Range   Yeast Wet Prep HPF POC NONE SEEN NONE SEEN   Trich, Wet Prep NONE SEEN NONE SEEN   Clue Cells Wet Prep HPF POC PRESENT (A) NONE SEEN   WBC, Wet Prep HPF POC FEW (A) NONE SEEN   Sperm NONE SEEN   hCG, quantitative,  pregnancy     Status: Abnormal   Collection Time: 12/06/18 11:57 PM  Result Value Ref Range   hCG, Beta Chain, Quant, S 30,004 (H) <5 mIU/mL  CBC     Status: None   Collection Time: 12/06/18 11:57 PM  Result Value Ref Range   WBC 9.6 4.0 - 10.5 K/uL   RBC 4.00 3.87 - 5.11 MIL/uL   Hemoglobin 12.5 12.0 - 15.0 g/dL   HCT 38.1 36.0 - 46.0 %   MCV 95.3 80.0 - 100.0 fL   MCH 31.3 26.0 - 34.0 pg   MCHC 32.8 30.0 - 36.0 g/dL   RDW 12.4 11.5 - 15.5 %   Platelets 262 150 - 400 K/uL   nRBC 0.0 0.0 - 0.2 %   US Ob Transvaginal  Result Date: 12/07/2018 CLINICAL DATA:  Pregnant patient in early trimester pregnancy with vaginal bleeding. EXAM: TRANSVAGINAL OB ULTRASOUND TECHNIQUE: Transvaginal ultrasound was performed for complete evaluation of the gestation as well as the maternal uterus, adnexal regions, and pelvic cul-de-sac. COMPARISON:  Obstetric ultrasound 11/28/2018 FINDINGS: Intrauterine gestational sac: Single Yolk sac:  Visualized. Embryo:  Visualized. Cardiac Activity: Visualized. Heart Rate: 108 bpm CRL:   7.1 mm   6 w 4 d                  [place] EDC: 07/29/2019 Subchorionic hemorrhage:  Small. Maternal uterus/adnexae: The left ovary is normal. There is a corpus luteal cyst in the right ovary. No suspicious adnexal mass. No pelvic free fluid. IMPRESSION: 1. Single live intrauterine pregnancy estimated gestational age [redacted] weeks 4 days based on crown-rump length for ultrasound [facility] 07/29/2019. 2. Small subchorionic hemorrhage. Electronically Signed   By: Keith Rake M.D.   On: 12/07/2018 01:11    MDM Pelvic Exam with cultures Labs: hCG, CBC, UA, Wet prep, and GC/CT TVUS Assessment and Plan  23 year old G11P00(10)1 at [facility]  Vaginal Bleeding  -Exam findings discussed. -Patient expresses concern of miscarriage. -Reassurances given and patient informed that more testing necessary before that diagnosis can be formally made. -WP sent. -Will send for [place]. -Offered and declined pain medication.    Follow Up (1:45 AM) Bacterial Vaginosis SIUP at 7 weeks [facility] A Positive  -Wet prep returns significant for clue cells. -Results discussed with patient -Rx for Metronidazole 500mg  sent to pharmacy on file.  -Discussed [place] results including [facility]. -Bleeding precautions given. -Encouraged continued pelvic rest throughout the first trimester. -No questions or concerns. -Encouraged to initiate [facility] -Encouraged to call or return to MAU if symptoms worsen or with the onset of new symptoms. -Discharged to home in stable condition  Cherre RobinsJessica L Darey Hershberger MSN, CNM 12/06/2018, 11:35 PM

## 2018-12-06 NOTE — MAU Note (Signed)
Patient presents to MAU c/o bright red blood vaginal bleeding that started today.  Patient stated the bleeding is heavier than before and it's now bright red instead of [place] when she was seen about a week ago.  She states she is worried she's having a miscarriage.

## 2018-12-07 ENCOUNTER — Encounter (HOSPITAL_COMMUNITY): Payer: Self-pay

## 2018-12-07 ENCOUNTER — Inpatient Hospital Stay (HOSPITAL_COMMUNITY): Payer: Medicaid Other

## 2018-12-07 DIAGNOSIS — B9689 Other specified bacterial agents as the cause of diseases classified elsewhere: Secondary | ICD-10-CM

## 2018-12-07 DIAGNOSIS — O98813 Other maternal infectious and parasitic diseases complicating pregnancy, third trimester: Secondary | ICD-10-CM

## 2018-12-07 DIAGNOSIS — O418X30 Other specified disorders of amniotic fluid and membranes, third trimester, not applicable or unspecified: Secondary | ICD-10-CM

## 2018-12-07 DIAGNOSIS — Z3A01 Less than 8 weeks gestation of pregnancy: Secondary | ICD-10-CM

## 2018-12-07 DIAGNOSIS — N760 Acute vaginitis: Secondary | ICD-10-CM

## 2018-12-07 LAB — WET PREP, GENITAL
Sperm: NONE SEEN
Trich, Wet Prep: NONE SEEN
Yeast Wet Prep HPF POC: NONE SEEN

## 2018-12-07 LAB — CBC
HCT: 38.1 % (ref 36.0–46.0)
Hemoglobin: 12.5 g/dL (ref 12.0–15.0)
MCH: 31.3 pg (ref 26.0–34.0)
MCHC: 32.8 g/dL (ref 30.0–36.0)
MCV: 95.3 fL (ref 80.0–100.0)
Platelets: 262 10*3/uL (ref 150–400)
RBC: 4 MIL/uL (ref 3.87–5.11)
RDW: 12.4 % (ref 11.5–15.5)
WBC: 9.6 10*3/uL (ref 4.0–10.5)
nRBC: 0 % (ref 0.0–0.2)

## 2018-12-07 LAB — HCG, QUANTITATIVE, PREGNANCY: hCG, Beta Chain, Quant, S: 30004 m[IU]/mL — ABNORMAL HIGH (ref <5)

## 2018-12-07 MED ORDER — METRONIDAZOLE 500 MG PO TABS: 500.0000 mg | ORAL_TABLET | Freq: Two times a day (BID) | ORAL | 0 refills | Status: DC

## 2018-12-07 MED ORDER — METRONIDAZOLE 0.75 % VA GEL: 1.0000 | Freq: Every day | VAGINAL | 0 refills | Status: DC

## 2018-12-07 NOTE — Discharge Instructions (Signed)
Subchorionic Hematoma ° °A subchorionic hematoma is a gathering of blood between the outer wall of the embryo (chorion) and the inner wall of the womb (uterus). °This condition can cause vaginal bleeding. If they cause little or no vaginal bleeding, early small hematomas usually shrink on their own and do not affect your baby or pregnancy. When bleeding starts later in pregnancy, or if the hematoma is larger or occurs in older pregnant women, the condition may be more serious. Larger hematomas may get bigger, which increases the chances of miscarriage. This condition also increases the risk of: °· Premature separation of the placenta from the uterus. °· Premature (preterm) labor. °· Stillbirth. °What are the causes? °The exact cause of this condition is not known. It occurs when blood is trapped between the placenta and the uterine wall because the placenta has separated from the original site of implantation. °What increases the risk? °You are more likely to develop this condition if: °· You were treated with fertility medicines. °· You conceived through in vitro fertilization (IVF). °What are the signs or symptoms? °Symptoms of this condition include: °· Vaginal spotting or bleeding. °· Contractions of the uterus. These cause abdominal pain. °Sometimes you may have no symptoms and the bleeding may only be seen when ultrasound images are taken (transvaginal ultrasound). °How is this diagnosed? °This condition is diagnosed based on a physical exam. This includes a pelvic exam. You may also have other tests, including: °· Blood tests. °· Urine tests. °· Ultrasound of the abdomen. °How is this treated? °Treatment for this condition can vary. Treatment may include: °· Watchful waiting. You will be monitored closely for any changes in bleeding. During this stage: °? The hematoma may be reabsorbed by the body. °? The hematoma may separate the fluid-filled space containing the embryo (gestational sac) from the wall of the  womb (endometrium). °· Medicines. °· Activity restriction. This may be needed until the bleeding stops. °Follow these instructions at home: °· Stay on bed rest if told to do so by your health care provider. °· Do not lift anything that is heavier than 10 lbs. (4.5 kg) or as told by your health care provider. °· Do not use any products that contain nicotine or tobacco, such as cigarettes and e-cigarettes. If you need help quitting, ask your health care provider. °· Track and write down the number of pads you use each day and how soaked (saturated) they are. °· Do not use tampons. °· Keep all follow-up visits as told by your health care provider. This is important. Your health care provider may ask you to have follow-up blood tests or ultrasound tests or both. °Contact a health care provider if: °· You have any vaginal bleeding. °· You have a fever. °Get help right away if: °· You have severe cramps in your stomach, back, abdomen, or pelvis. °· You pass large clots or tissue. Save any tissue for your health care provider to look at. °· You have more vaginal bleeding, and you faint or become lightheaded or weak. °Summary °· A subchorionic hematoma is a gathering of blood between the outer wall of the placenta and the uterus. °· This condition can cause vaginal bleeding. °· Sometimes you may have no symptoms and the bleeding may only be seen when ultrasound images are taken. °· Treatment may include watchful waiting, medicines, or activity restriction. °This information is not intended to replace advice given to you by your health care provider. Make sure you discuss any questions you   have with your health care provider. Document Released: 09/18/2006 Document Revised: 07/30/2016 Document Reviewed: 07/30/2016 Elsevier Interactive Patient Education  2019 [employer] Bacterial Vaginosis  Bacterial vaginosis is a vaginal infection that occurs when the normal balance of bacteria in the vagina is disrupted. It results  from an overgrowth of certain bacteria. This is the most common vaginal infection among women ages 56-44. Because bacterial vaginosis increases your risk for STIs (sexually transmitted infections), getting treated can help reduce your risk for chlamydia, gonorrhea, herpes, and HIV (human immunodeficiency virus). Treatment is also important for preventing complications in pregnant women, because this condition can cause an early (premature) delivery. What are the causes? This condition is caused by an increase in harmful bacteria that are normally present in small amounts in the vagina. However, the reason that the condition develops is not fully understood. What increases the risk? The following factors may make you more likely to develop this condition:  Having a new sexual partner or multiple sexual partners.  Having unprotected sex.  Douching.  Having an intrauterine device (IUD).  Smoking.  Drug and alcohol abuse.  Taking certain antibiotic medicines.  Being pregnant. You cannot get bacterial vaginosis from toilet seats, bedding, swimming pools, or contact with objects around you. What are the signs or symptoms? Symptoms of this condition include:  Grey or white vaginal discharge. The discharge can also be watery or foamy.  A fish-like odor with discharge, especially after sexual intercourse or during menstruation.  Itching in and around the vagina.  Burning or pain with urination. Some women with bacterial vaginosis have no signs or symptoms. How is this diagnosed? This condition is diagnosed based on:  Your medical history.  A physical exam of the vagina.  Testing a sample of vaginal fluid under a microscope to look for a large amount of bad bacteria or abnormal cells. Your health care provider may use a cotton swab or a small wooden spatula to collect the sample. How is this treated? This condition is treated with antibiotics. These may be given as a pill, a vaginal  cream, or a medicine that is put into the vagina (suppository). If the condition comes back after treatment, a second round of antibiotics may be needed. Follow these instructions at home: Medicines  Take over-the-counter and prescription medicines only as told by your health care provider.  Take or use your antibiotic as told by your health care provider. Do not stop taking or using the antibiotic even if you start to feel better. General instructions  If you have a female sexual partner, tell her that you have a vaginal infection. She should see her health care provider and be treated if she has symptoms. If you have a female sexual partner, he does not need treatment.  During treatment: ? Avoid sexual activity until you finish treatment. ? Do not douche. ? Avoid alcohol as directed by your health care provider. ? Avoid breastfeeding as directed by your health care provider.  Drink enough water and fluids to keep your urine clear or pale yellow.  Keep the area around your vagina and rectum clean. ? Wash the area daily with warm water. ? Wipe yourself from front to back after using the toilet.  Keep all follow-up visits as told by your health care provider. This is important. How is this prevented?  Do not douche.  Wash the outside of your vagina with warm water only.  Use protection when having sex. This includes latex condoms and dental  dams. °· Limit how many sexual partners you have. To help prevent bacterial vaginosis, it is best to have sex with just one partner (monogamous). °· Make sure you and your sexual partner are tested for STIs. °· Wear cotton or cotton-lined underwear. °· Avoid wearing tight pants and pantyhose, especially during summer. °· Limit the amount of alcohol that you drink. °· Do not use any products that contain nicotine or tobacco, such as cigarettes and e-cigarettes. If you need help quitting, ask your health care provider. °· Do not use illegal drugs. °Where  to find more information °· Centers for Disease Control and Prevention: www.cdc.gov/std °· American Sexual Health Association (ASHA): www.ashastd.org °· U.S. Department of Health and Human Services, Office on Women's Health: www.womenshealth.gov/ or https://www.womenshealth.gov/a-z-topics/bacterial-vaginosis °Contact a health care provider if: °· Your symptoms do not improve, even after treatment. °· You have more discharge or pain when urinating. °· You have a fever. °· You have pain in your abdomen. °· You have pain during sex. °· You have vaginal bleeding between periods. °Summary °· Bacterial vaginosis is a vaginal infection that occurs when the normal balance of bacteria in the vagina is disrupted. °· Because bacterial vaginosis increases your risk for STIs (sexually transmitted infections), getting treated can help reduce your risk for chlamydia, gonorrhea, herpes, and HIV (human immunodeficiency virus). Treatment is also important for preventing complications in pregnant women, because the condition can cause an early (premature) delivery. °· This condition is treated with antibiotic medicines. These may be given as a pill, a vaginal cream, or a medicine that is put into the vagina (suppository). °This information is not intended to replace advice given to you by your health care provider. Make sure you discuss any questions you have with your health care provider. °Document Released: 06/03/2005 Document Revised: 10/07/2016 Document Reviewed: 02/17/2016 °Elsevier Interactive Patient Education © 2019 Elsevier Inc. ° °

## 2019-01-14 IMAGING — DX DG CHEST 2V
2 series · 2 of 2 positions shown · non-contrast
Comparison: Report 07/03/1995

CLINICAL DATA: Chest pain

EXAM:
CHEST - 2 VIEW

[w chest pa]
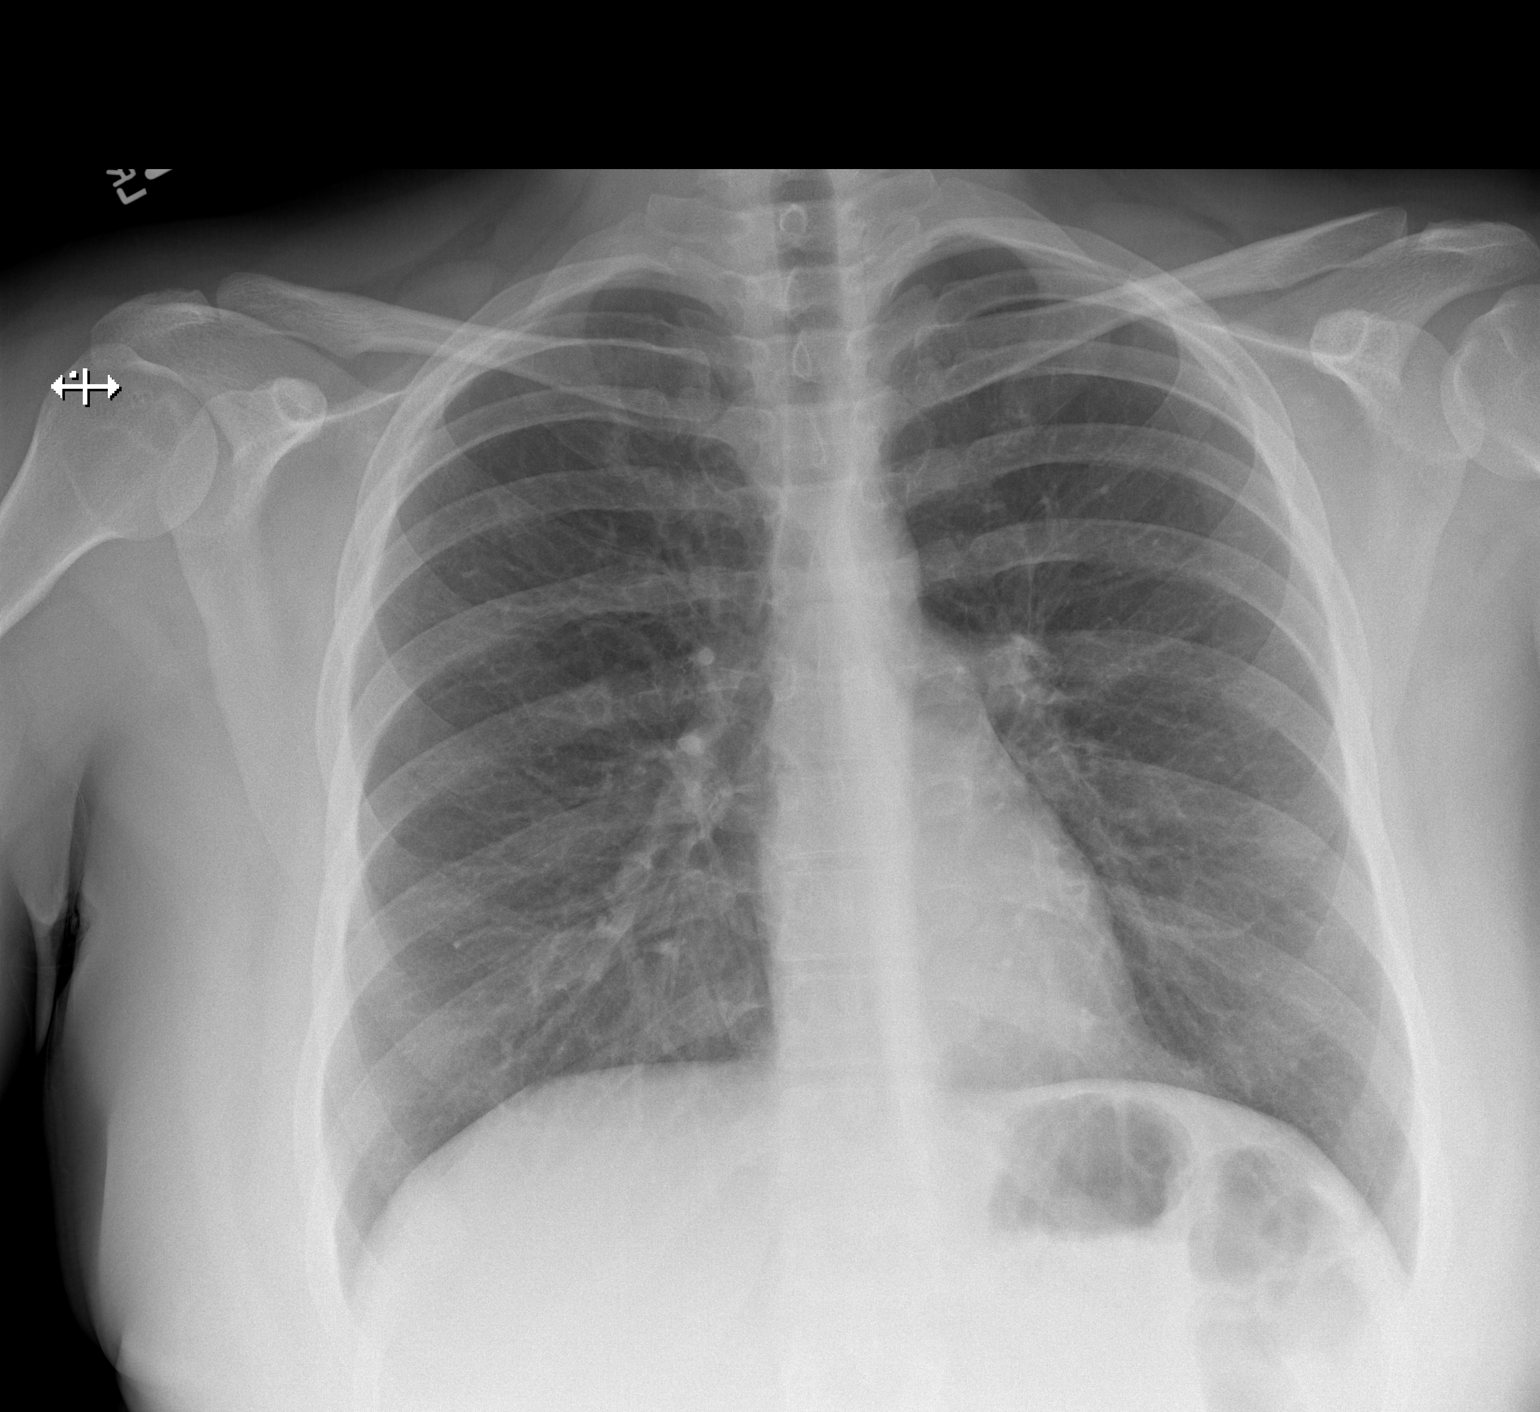

[w chest lat]
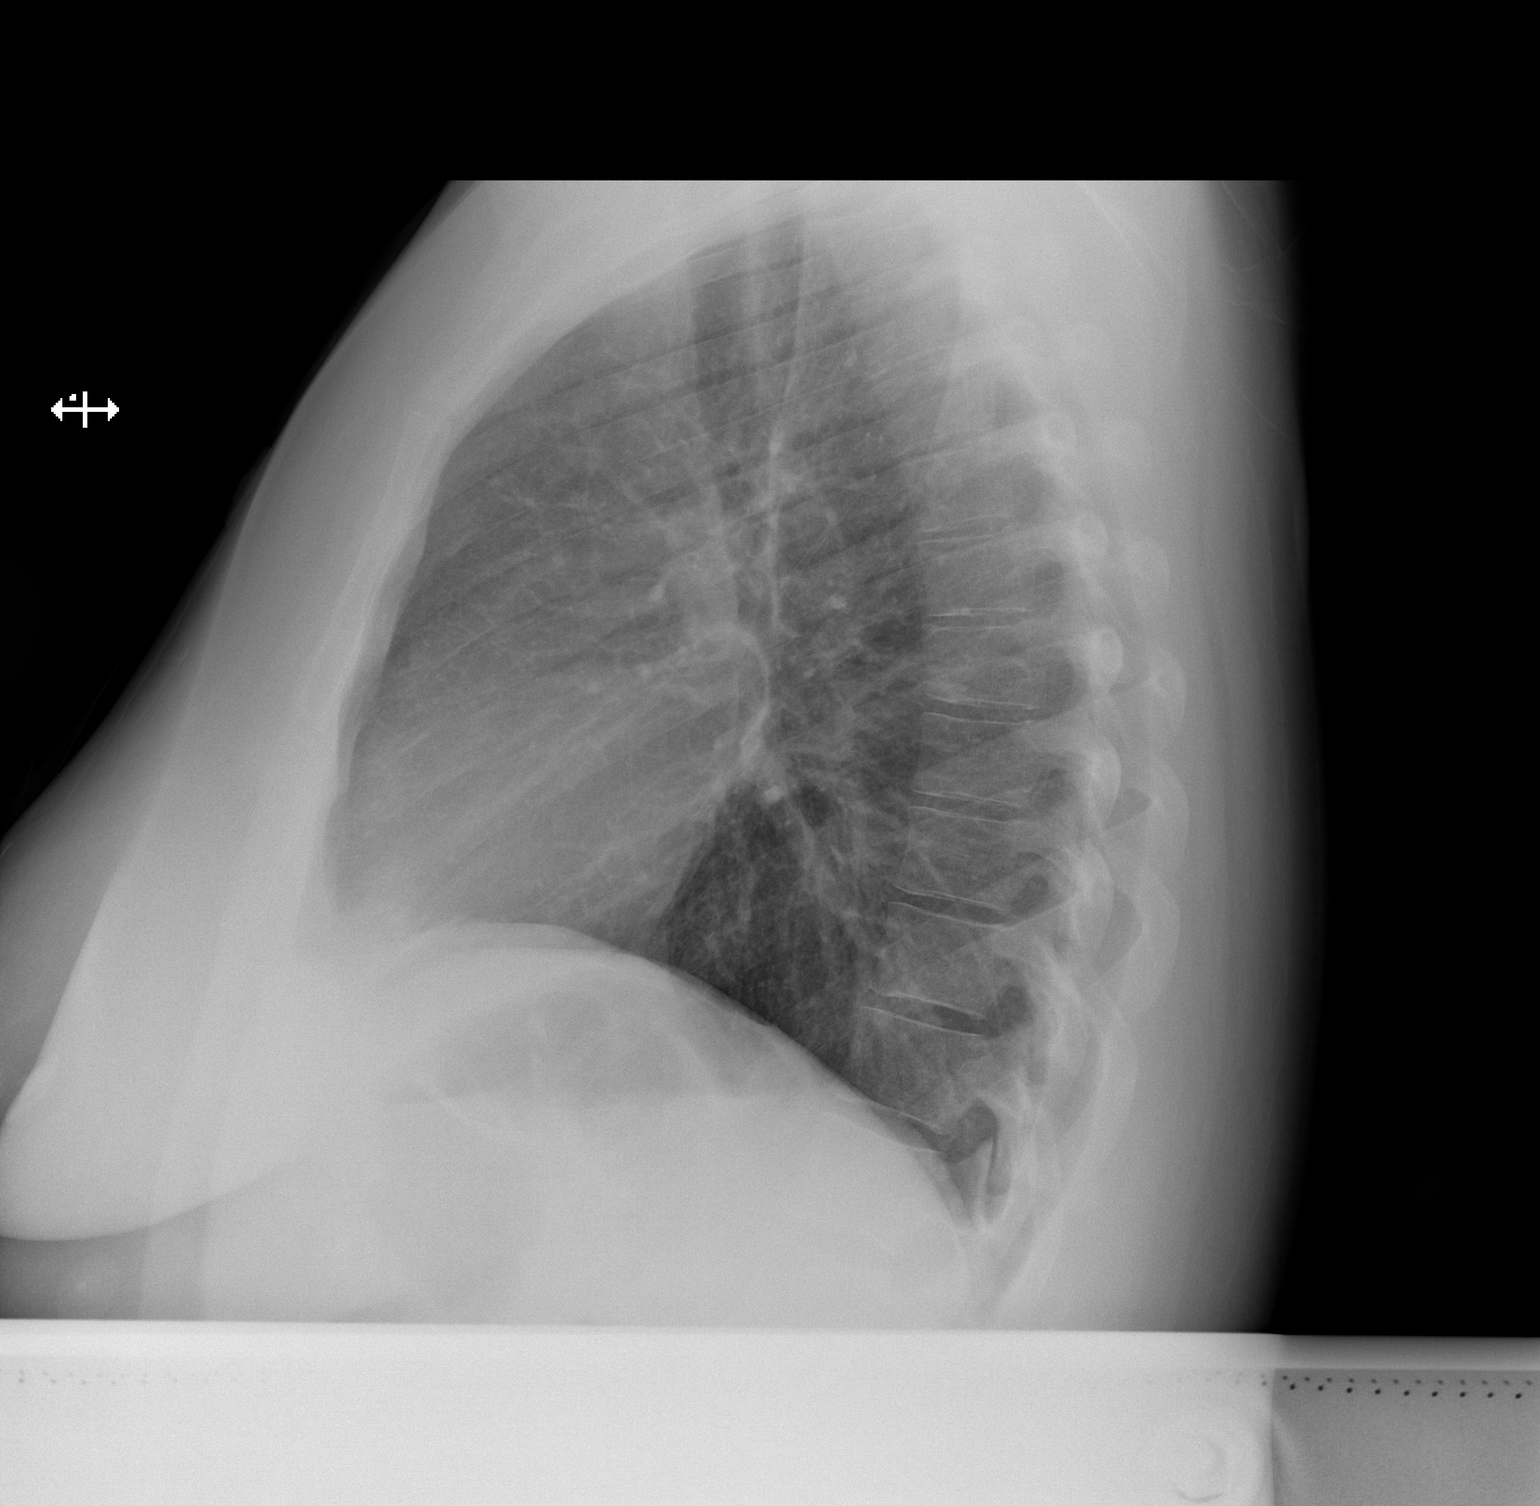

[2 of 2 positions shown; findings below may reference images not displayed]

FINDINGS: The heart size and mediastinal contours are within normal limits.
Both lungs are clear. The visualized skeletal structures are
unremarkable.
IMPRESSION: No active cardiopulmonary disease.

## 2019-11-15 IMAGING — US OBSTETRIC <14 WK US AND TRANSVAGINAL OB US
1 series · 15 of 28 positions shown · non-contrast
Comparison: None.

CLINICAL DATA: Bleeding, pain

EXAM:
OBSTETRIC <14 WK US AND TRANSVAGINAL OB US
TECHNIQUE: Both transabdominal and transvaginal ultrasound examinations were
performed for complete evaluation of the gestation as well as the
maternal uterus, adnexal regions, and pelvic cul-de-sac.
Transvaginal technique was performed to assess early pregnancy.

[Series 1: obstetric <14 wk us and transvaginal ob us · 15 of 35 slices shown]
[im 1/35]
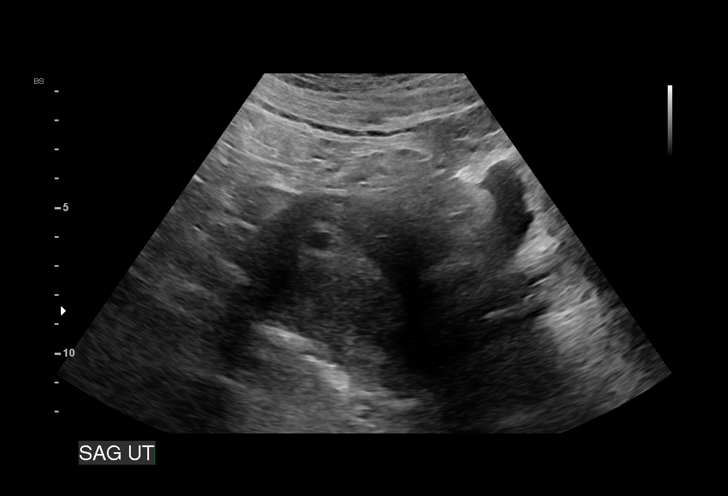
[im 3/35]
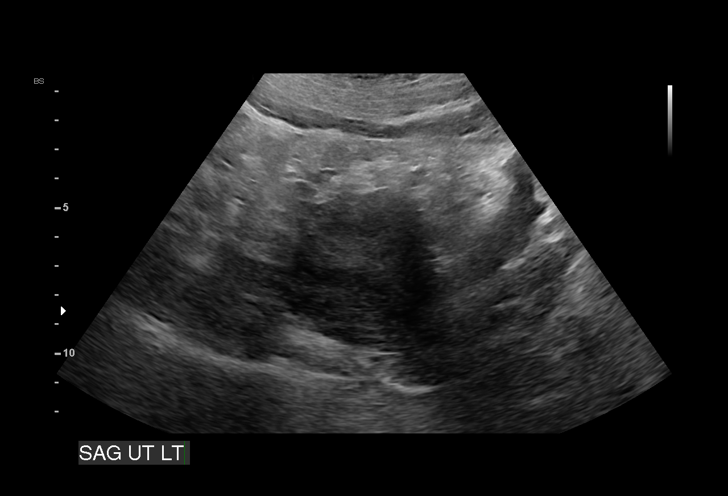
[im 6/35]
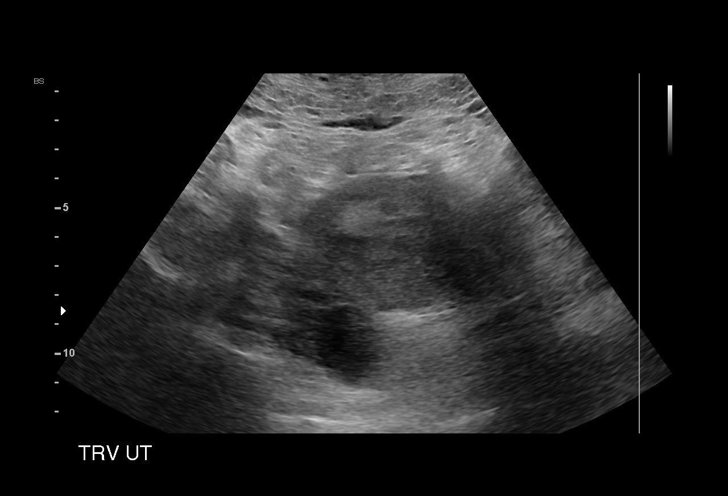
[im 8/35]
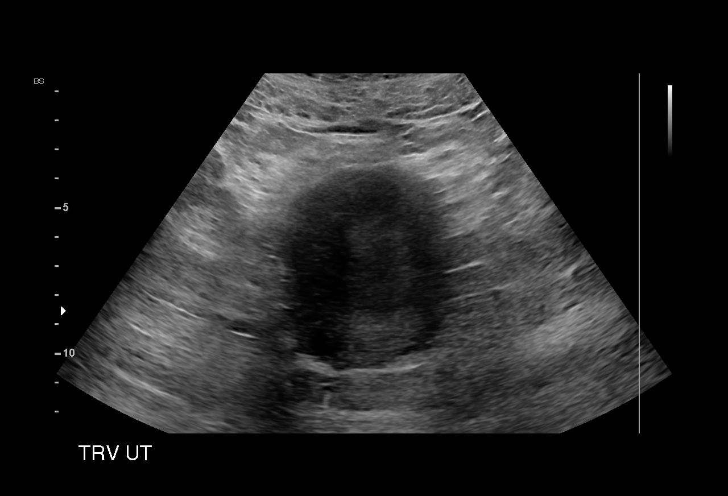
[im 11/35]
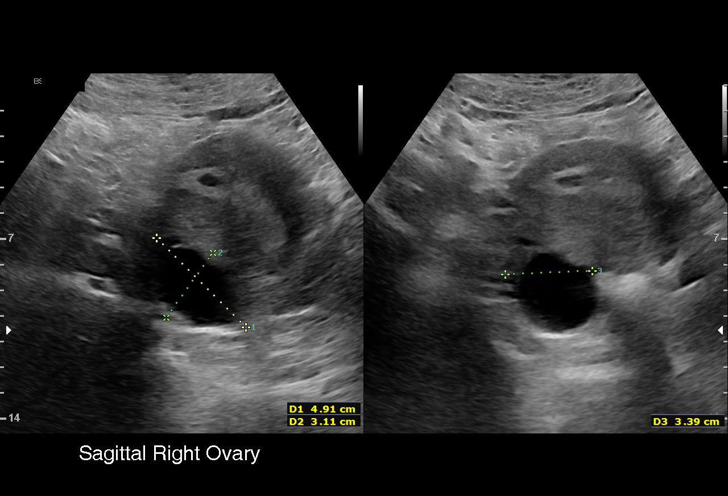
[im 13/35]
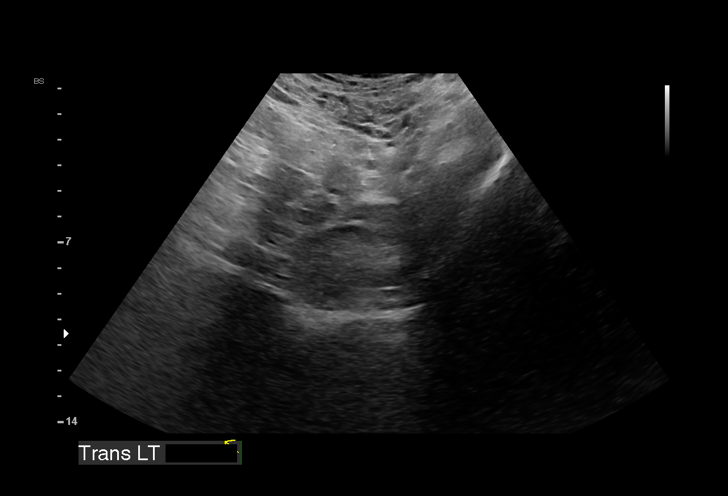
[im 16/35]
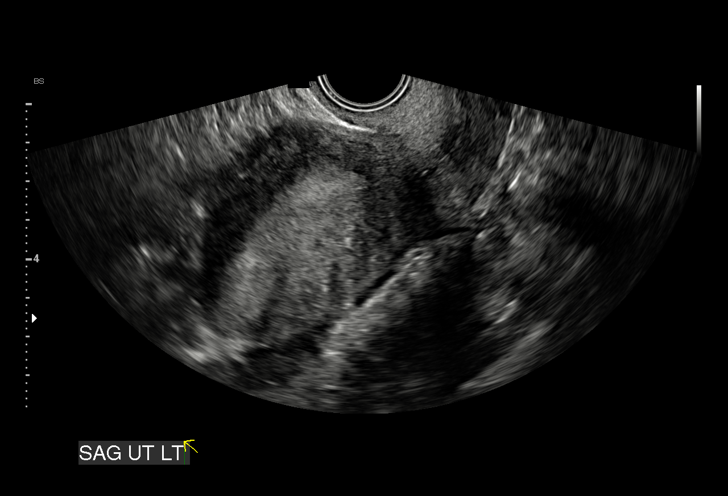
[im 18/35]
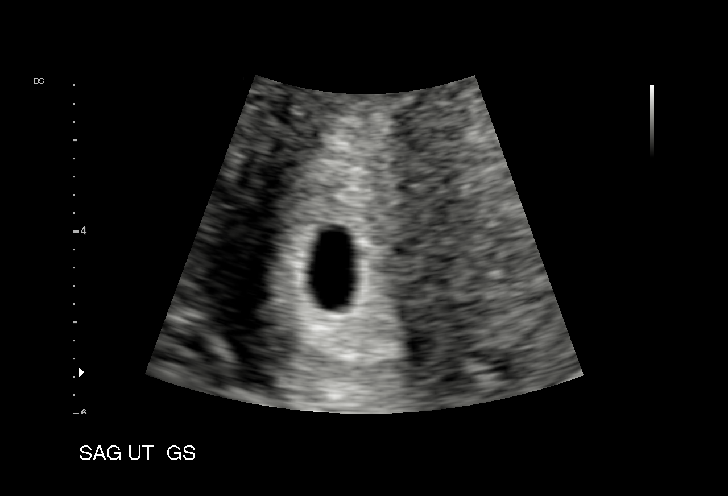
[im 19/35]
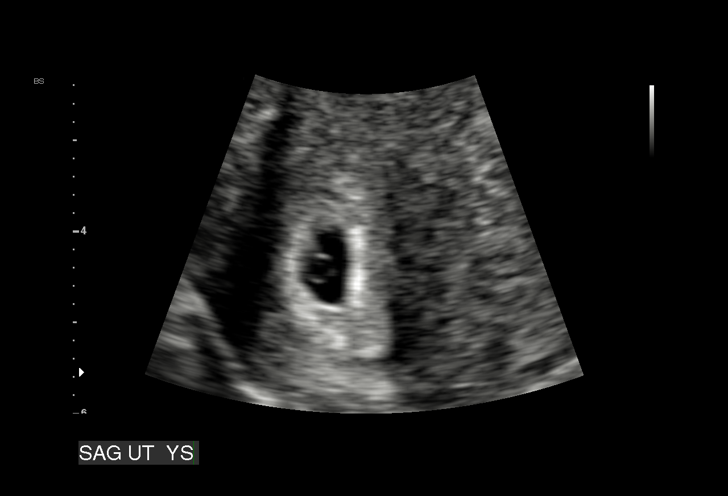
[im 22/35]
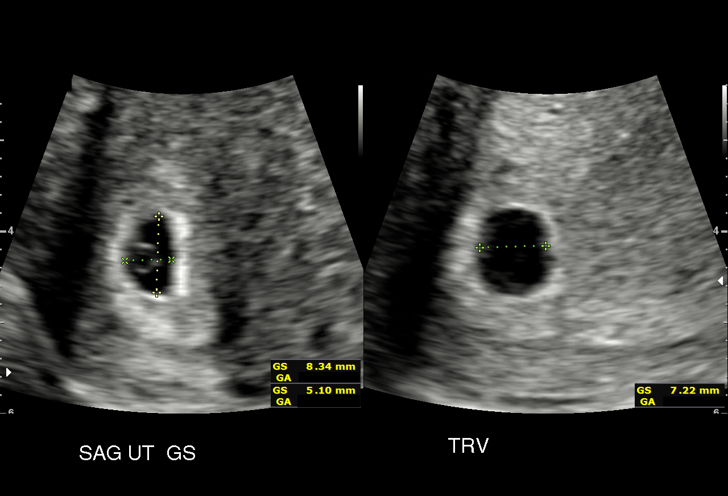
[im 24/35]
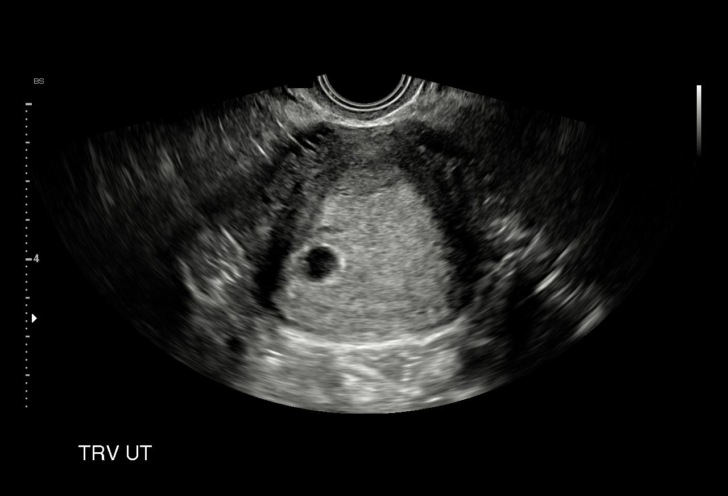
[im 27/35]
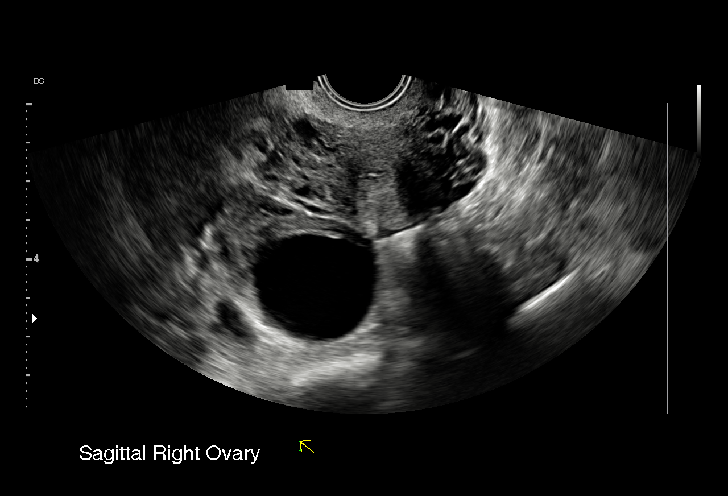
[im 29/35]
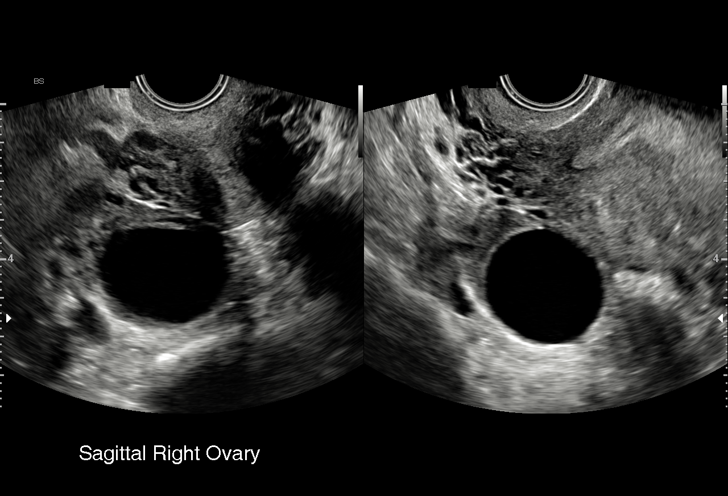
[im 32/35]
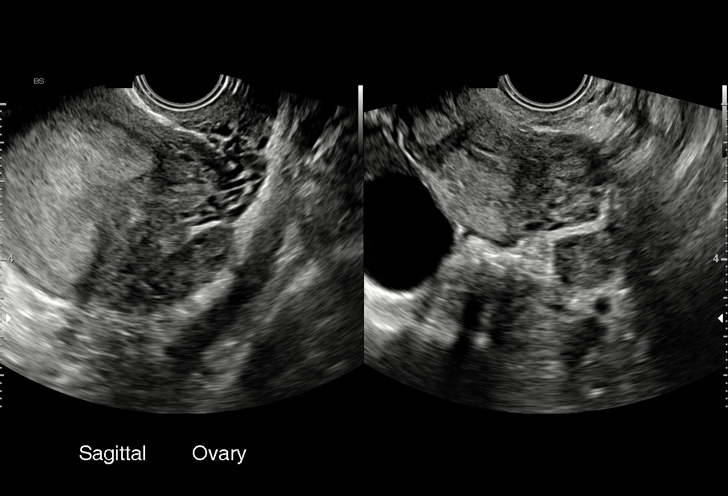
[im 35/35]
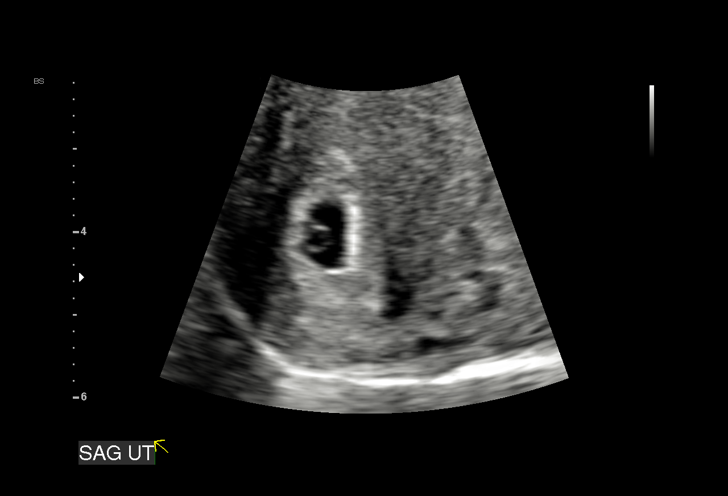

[15 of 28 positions shown; findings below may reference images not displayed]

FINDINGS: Intrauterine gestational sac: Single

Yolk sac:  Visualized

Embryo:  Not visualized

Cardiac Activity: Not visualized

Heart Rate:   bpm

MSD: 6.9 mm   5 w   2 d

CRL:    mm    w    d                  US EDC:

Subchorionic hemorrhage:  None visualized.

Maternal uterus/adnexae: No adnexal mass or free fluid.
IMPRESSION: Early intrauterine gestational sac, 5 weeks 2 days by mean sac
diameter. Yolk sac is visualized but no fetal pole currently. No
acute maternal findings. This could be followed with repeat
ultrasound in 14 days to ensure expected progression as clinically
indicated.

## 2019-11-24 IMAGING — US TRANSVAGINAL OB ULTRASOUND
1 series · 15 of 23 positions shown · non-contrast
Comparison: Obstetric ultrasound 11/28/2018

CLINICAL DATA: Pregnant patient in early trimester pregnancy with
vaginal bleeding.

EXAM:
TRANSVAGINAL OB ULTRASOUND
TECHNIQUE: Transvaginal ultrasound was performed for complete evaluation of the
gestation as well as the maternal uterus, adnexal regions, and
pelvic cul-de-sac.

[Series 1: transvaginal ob ultrasound · 23 acquisitions, 15 frames shown]
[im 1/23]
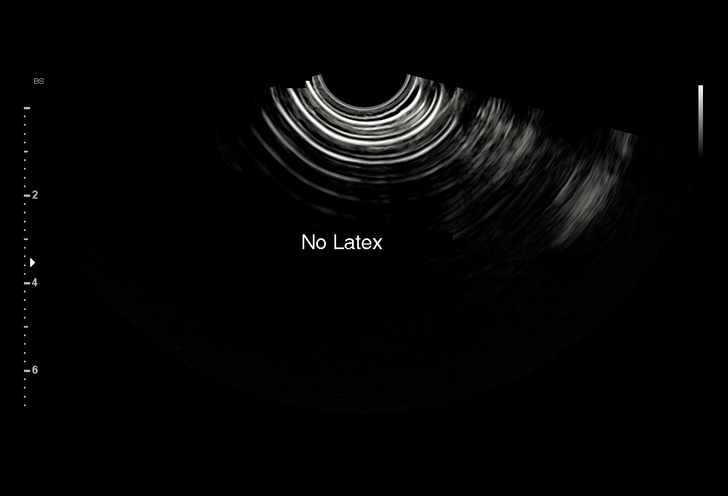
[im 3/23]
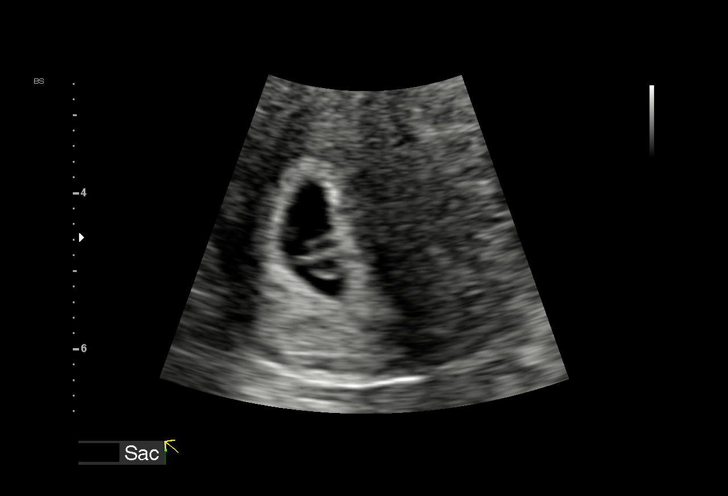
[im 4/23]
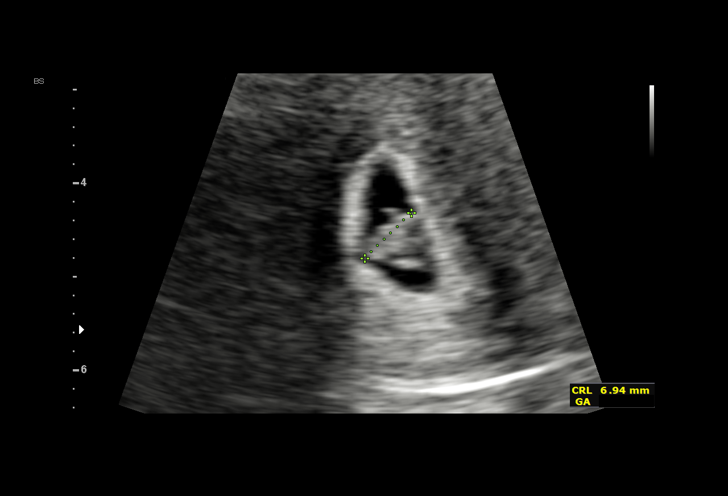
[im 6/23]
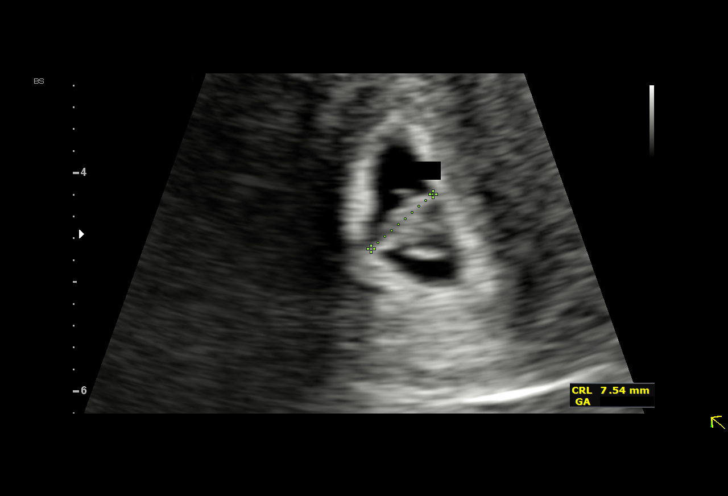
[im 7/23]
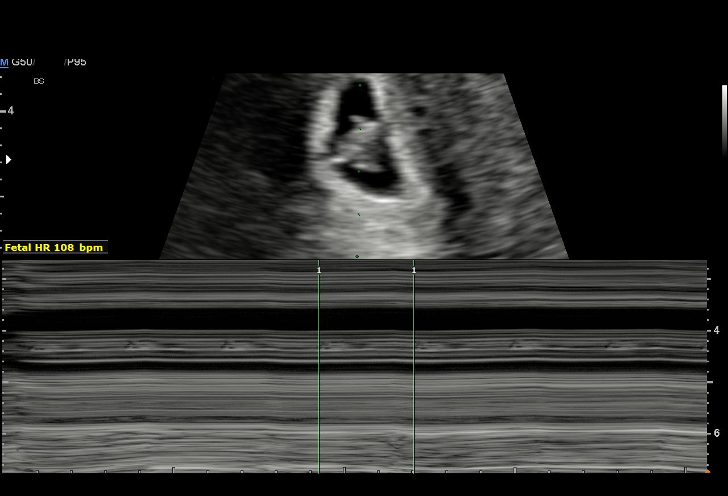
[im 9/23]
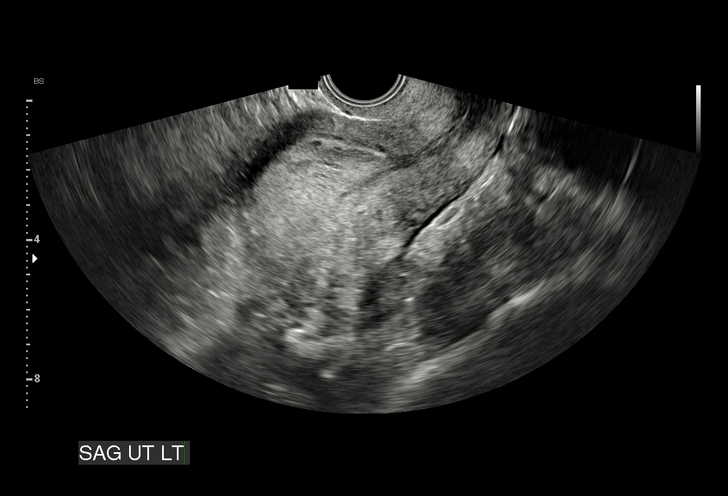
[im 10/23]
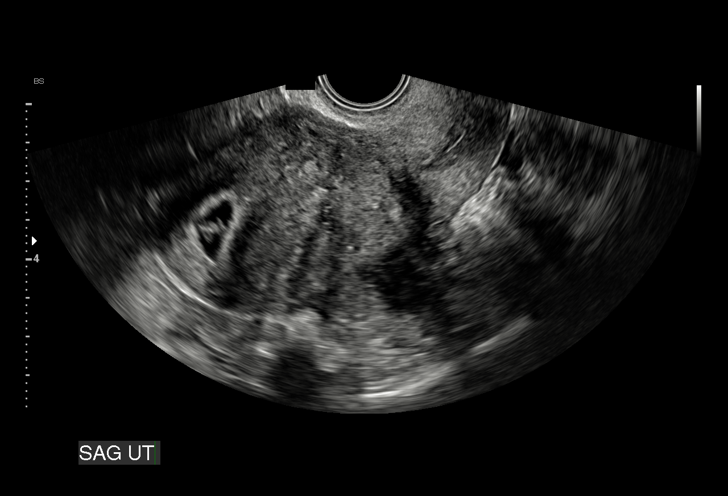
[im 12/23]
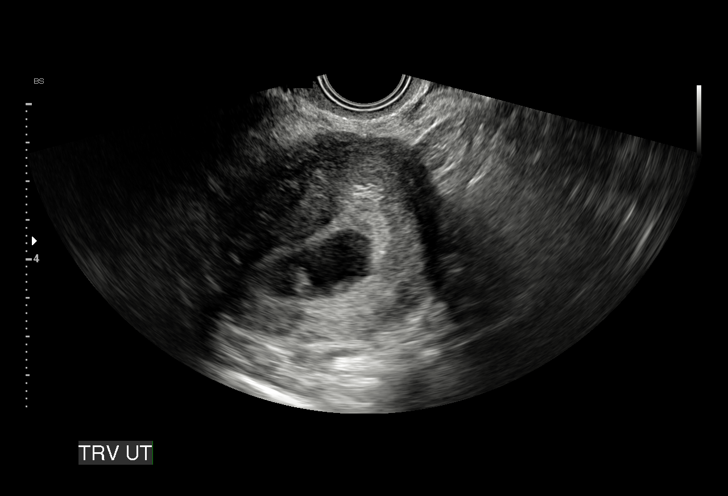
[im 14/23]
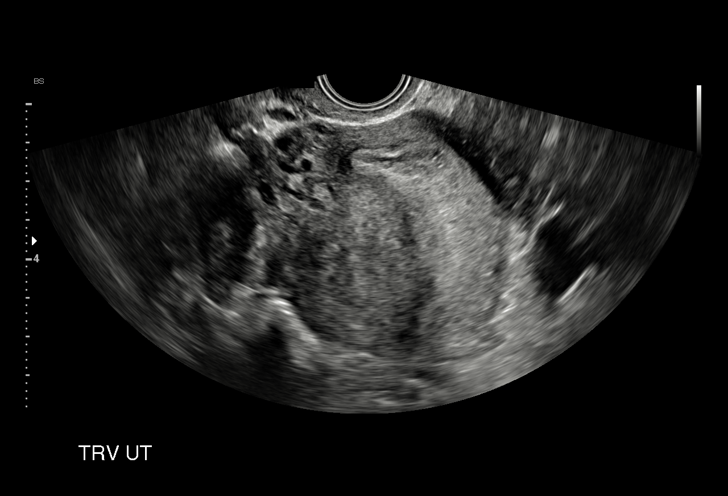
[im 15/23]
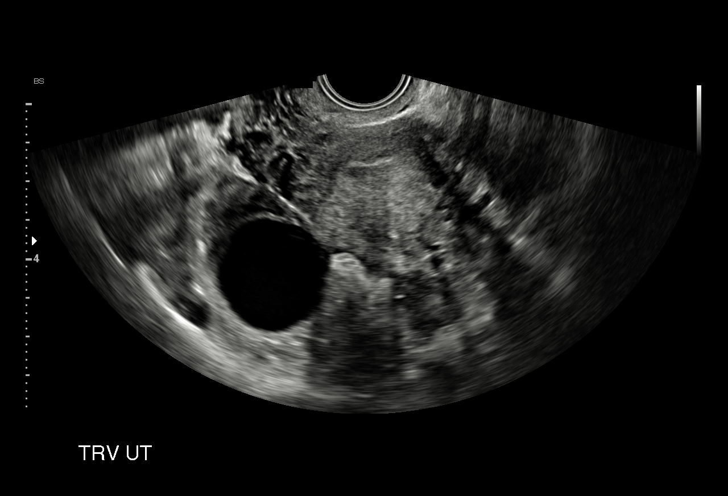
[im 17/23]
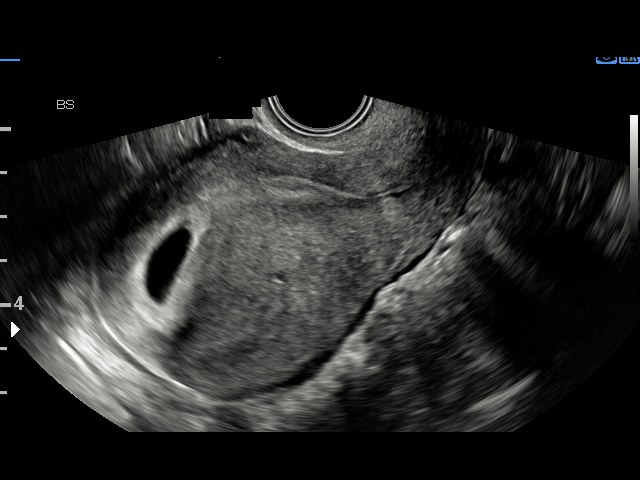
[im 18/23]
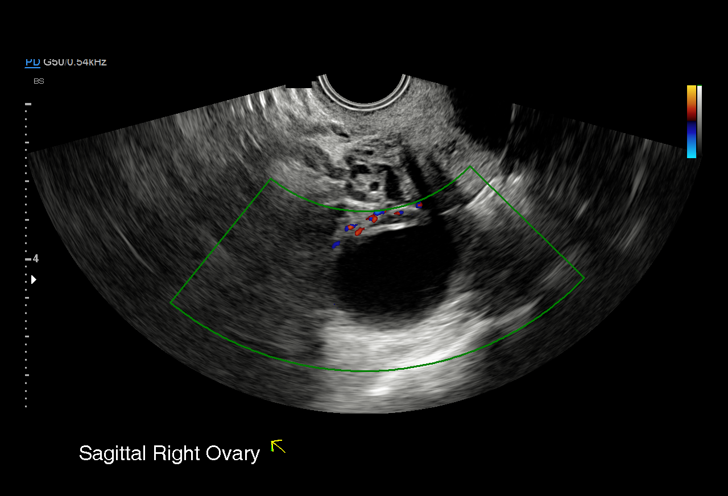
[im 20/23]
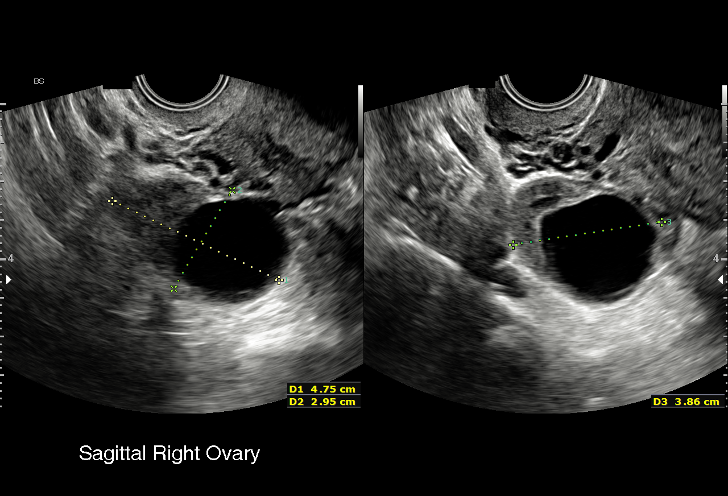
[im 21/23]
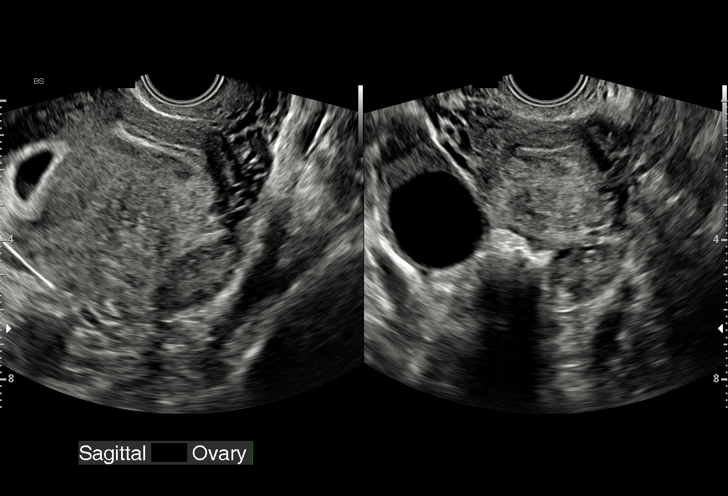
[im 23/23]
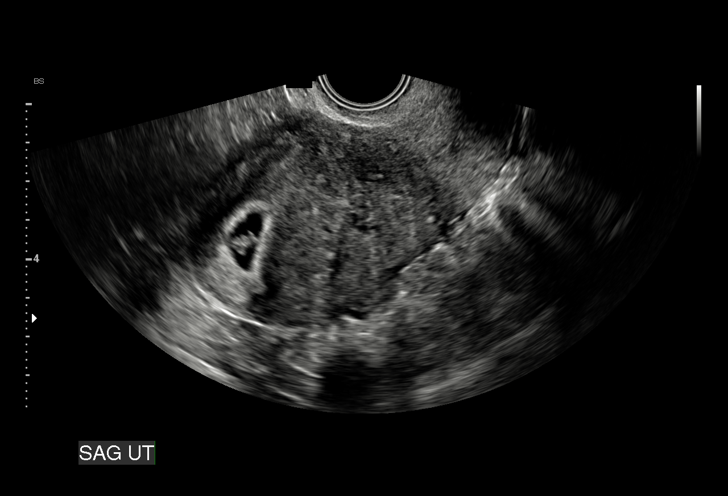

[15 of 23 positions shown; findings below may reference images not displayed]

FINDINGS: Intrauterine gestational sac: Single

Yolk sac:  Visualized.

Embryo:  Visualized.

Cardiac Activity: Visualized.

Heart Rate: 108 bpm

CRL:   7.1 mm   6 w 4 d                  US EDC: 07/29/2019

Subchorionic hemorrhage:  Small.

Maternal uterus/adnexae: The left ovary is normal. There is a corpus
luteal cyst in the right ovary. No suspicious adnexal mass. No
pelvic free fluid.
IMPRESSION: 1. Single live intrauterine pregnancy estimated gestational age 6
weeks 4 days based on crown-rump length for ultrasound EDC
07/29/2019.
2. Small subchorionic hemorrhage.

## 2020-01-24 LAB — OB RESULTS CONSOLE HEPATITIS B SURFACE ANTIGEN
Hepatitis B Surface Ag: NEGATIVE
Hepatitis B Surface Ag: NEGATIVE

## 2020-01-24 LAB — OB RESULTS CONSOLE RUBELLA ANTIBODY, IGM
Rubella: IMMUNE
Rubella: IMMUNE

## 2020-05-03 LAB — OB RESULTS CONSOLE RPR: RPR: NONREACTIVE

## 2020-05-03 LAB — OB RESULTS CONSOLE HIV ANTIBODY (ROUTINE TESTING): HIV: NONREACTIVE

## 2020-06-17 NOTE — L&D Delivery Note (Signed)
[facility]N Faculty Practice Delivery Note  Lisa Preston is a 26 y.o. [id number] s/p [redacted]w[redacted]d. She was admitted for SOL.   ROM: 0h 43m with clear fluid GBS Status: negative Maximum Maternal Temperature: 98.58F  Labor Progress: . Patient presented from ED in active labor. Cervix 7-8cm when she arrived to the floor. Progressed very quickly and delivered shortly after arrival   Delivery Date/Time: 07/26/20 @0027  Delivery: Head delivered LOA. Loose nuchal x1, reduced after delivery. Shoulder and body delivered in usual fashion. Infant with spontaneous cry, placed on mother's abdomen, dried and stimulated. Cord clamped x 2 after 1-minute delay, and cut by FOB under my direct supervision. Cord blood drawn. Placenta delivered spontaneously with gentle cord traction. Fundus firm with massage and Pitocin. Labia, perineum, vagina, and cervix were inspected, with out lacerations.   Placenta: complete, three vessel cord appreciated Complications: none Lacerations: none EBL: 100 mL Analgesia: IV Fentanyl   Infant: APGARs 8, 9  weight pending   , DO [facility]

## 2020-07-12 LAB — OB RESULTS CONSOLE GC/CHLAMYDIA
Chlamydia: NEGATIVE
Gonorrhea: NEGATIVE

## 2020-07-12 LAB — OB RESULTS CONSOLE GBS
GBS: NEGATIVE
GBS: NEGATIVE
GBS: NEGATIVE

## 2020-07-25 ENCOUNTER — Inpatient Hospital Stay (HOSPITAL_COMMUNITY)
Admission: EM | Admit: 2020-07-25 | Discharge: 2020-07-27 | DRG: 807 | Disposition: A | Discharge status: Home / Self Care | Payer: Medicaid Other | Attending: Obstetrics and Gynecology | Admitting: Obstetrics and Gynecology

## 2020-07-25 ENCOUNTER — Other Ambulatory Visit: Payer: Self-pay | Admitting: Women's Health

## 2020-07-25 DIAGNOSIS — F17210 Nicotine dependence, cigarettes, uncomplicated: Secondary | ICD-10-CM | POA: Diagnosis present

## 2020-07-25 DIAGNOSIS — O26893 Other specified pregnancy related conditions, third trimester: Secondary | ICD-10-CM | POA: Diagnosis present

## 2020-07-25 DIAGNOSIS — Z3A38 38 weeks gestation of pregnancy: Secondary | ICD-10-CM | POA: Diagnosis not present

## 2020-07-25 DIAGNOSIS — O99214 Obesity complicating childbirth: Secondary | ICD-10-CM | POA: Diagnosis present

## 2020-07-25 DIAGNOSIS — J45909 Unspecified asthma, uncomplicated: Secondary | ICD-10-CM | POA: Diagnosis present

## 2020-07-25 DIAGNOSIS — O9952 Diseases of the respiratory system complicating childbirth: Secondary | ICD-10-CM | POA: Diagnosis present

## 2020-07-25 DIAGNOSIS — O99334 Smoking (tobacco) complicating childbirth: Secondary | ICD-10-CM | POA: Diagnosis present

## 2020-07-25 DIAGNOSIS — Z20822 Contact with and (suspected) exposure to covid-19: Secondary | ICD-10-CM | POA: Diagnosis present

## 2020-07-25 DIAGNOSIS — O4202 Full-term premature rupture of membranes, onset of labor within 24 hours of rupture: Secondary | ICD-10-CM | POA: Diagnosis not present

## 2020-07-25 DIAGNOSIS — Z9889 Other specified postprocedural states: Secondary | ICD-10-CM | POA: Diagnosis not present

## 2020-07-25 MED ORDER — PHENYLEPHRINE 40 MCG/ML (10ML) SYRINGE FOR IV PUSH (FOR BLOOD PRESSURE SUPPORT): 80.0000 ug | PREFILLED_SYRINGE | INTRAVENOUS | Status: DC | PRN

## 2020-07-25 MED ORDER — OXYTOCIN-SODIUM CHLORIDE 30-0.9 UT/500ML-% IV SOLN
2.5000 [IU]/h | INTRAVENOUS | Status: DC
  Administered 2020-07-26: 2.5 [IU]/h via INTRAVENOUS
  Filled 2020-07-25: qty 500

## 2020-07-25 MED ORDER — SOD CITRATE-CITRIC ACID 500-334 MG/5ML PO SOLN: 30.0000 mL | ORAL | Status: DC | PRN

## 2020-07-25 MED ORDER — FENTANYL CITRATE (PF) 100 MCG/2ML IJ SOLN
INTRAMUSCULAR | Status: AC
  Administered 2020-07-25: 50 ug
  Filled 2020-07-25: qty 2

## 2020-07-25 MED ORDER — FLEET ENEMA 7-19 GM/118ML RE ENEM
1.0000 | ENEMA | RECTAL | Status: DC | PRN
Start: 2020-07-25 — End: 2020-07-26

## 2020-07-25 MED ORDER — LIDOCAINE HCL (PF) 1 % IJ SOLN
30.0000 mL | INTRAMUSCULAR | Status: DC | PRN
Start: 2020-07-25 — End: 2020-07-26

## 2020-07-25 MED ORDER — OXYCODONE-ACETAMINOPHEN 5-325 MG PO TABS: 1.0000 | ORAL_TABLET | ORAL | Status: DC | PRN

## 2020-07-25 MED ORDER — LACTATED RINGERS IV SOLN: 500.0000 mL | Freq: Once | INTRAVENOUS | Status: DC

## 2020-07-25 MED ORDER — LACTATED RINGERS IV SOLN: 500.0000 mL | INTRAVENOUS | Status: DC | PRN

## 2020-07-25 MED ORDER — DIPHENHYDRAMINE HCL 50 MG/ML IJ SOLN: 12.5000 mg | INTRAMUSCULAR | Status: DC | PRN

## 2020-07-25 MED ORDER — FENTANYL-BUPIVACAINE-NACL 0.5-0.125-0.9 MG/250ML-% EP SOLN
EPIDURAL | Status: AC
  Filled 2020-07-25: qty 250

## 2020-07-25 MED ORDER — HYDROXYZINE HCL 25 MG PO TABS
50.0000 mg | ORAL_TABLET | Freq: Four times a day (QID) | ORAL | Status: DC | PRN
  Filled 2020-07-25: qty 2

## 2020-07-25 MED ORDER — OXYCODONE-ACETAMINOPHEN 5-325 MG PO TABS: 2.0000 | ORAL_TABLET | ORAL | Status: DC | PRN

## 2020-07-25 MED ORDER — ACETAMINOPHEN 325 MG PO TABS: 650.0000 mg | ORAL_TABLET | ORAL | Status: DC | PRN

## 2020-07-25 MED ORDER — ONDANSETRON HCL 4 MG/2ML IJ SOLN: 4.0000 mg | Freq: Four times a day (QID) | INTRAMUSCULAR | Status: DC | PRN

## 2020-07-25 MED ORDER — FENTANYL-BUPIVACAINE-NACL 0.5-0.125-0.9 MG/250ML-% EP SOLN: 12.0000 mL/h | EPIDURAL | Status: DC | PRN

## 2020-07-25 MED ORDER — FENTANYL CITRATE (PF) 100 MCG/2ML IJ SOLN
100.0000 ug | INTRAMUSCULAR | Status: DC | PRN
  Administered 2020-07-26: 100 ug via INTRAVENOUS

## 2020-07-25 MED ORDER — EPHEDRINE 5 MG/ML INJ: 10.0000 mg | INTRAVENOUS | Status: DC | PRN

## 2020-07-25 MED ORDER — LACTATED RINGERS IV SOLN: INTRAVENOUS | Status: DC

## 2020-07-25 MED ORDER — OXYTOCIN BOLUS FROM INFUSION
333.0000 mL | Freq: Once | INTRAVENOUS | Status: AC
  Administered 2020-07-26: 333 mL via INTRAVENOUS

## 2020-07-25 MED ORDER — LACTATED RINGERS IV BOLUS
1000.0000 mL | Freq: Once | INTRAVENOUS | Status: AC
  Administered 2020-07-25: 1000 mL via INTRAVENOUS

## 2020-07-25 NOTE — ED Provider Notes (Signed)
[facility] EMERGENCY DEPARTMENT Provider Note   CSN: [id number] Arrival date & time: 07/25/20  2259     History Chief Complaint  Patient presents with  . Laboring    Lisa Preston is a 25 y.o. female G3P2 at 93W presents to the Emergency Department complaining of labor.  Pt reports she has been having contractions over the last several weeks but they acutely worsened in the last hour.  She does report loss of her mucous plug today but no leakage of amniotic fluid.  Admits to a small amount of bleeding.  Patient reports last 2 pregnancies were precipitous vaginal deliveries.  No complications with this pregnancy including no hypertension or gestational diabetes.  [facility] for women.  She reports regular prenatal care.  Patient unsure how close her contractions are but rates them as severe.  Does report vaginal pain and feeling the need to push.  The history is provided by the patient and medical records. No language interpreter was used.       Past Medical History:  Diagnosis Date  . ADHD   . Asthma   . Bipolar disorder (HCC)   . Depression   . GERD (gastroesophageal reflux disease)   . Migraine     There are no problems to display for this patient.   Past Surgical History:  Procedure Laterality Date  . TONSILLECTOMY    . WISDOM TOOTH EXTRACTION       OB History    Gravida  12   Para      Term      Preterm      AB  10   Living  1     SAB  10   IAB      Ectopic      Multiple      Live Births  1           No family history on file.  Social History   Tobacco Use  . Smoking status: Current Every Day Smoker    Packs/day: 0.50    Types: Cigarettes  . Smokeless tobacco: Never Used  Vaping Use  . Vaping Use: Never used  Substance Use Topics  . Alcohol use: Never  . Drug use: Never    Home Medications Prior to Admission medications   Medication Sig Start Date End Date Taking? Authorizing Provider   metroNIDAZOLE (FLAGYL) 500 MG tablet Take 1 tablet (500 mg total) by mouth 2 (two) times daily. 12/07/18   Gerrit Heck, CNM  Prenatal Vit-Fe Fumarate-FA (MULTIVITAMIN-PRENATAL) 27-0.8 MG TABS tablet Take 1 tablet by mouth daily at 12 noon.    [provider]    Allergies    Fluconazole  Review of Systems   Review of Systems  Gastrointestinal: Positive for abdominal pain.  Genitourinary: Positive for vaginal bleeding.  Psychiatric/Behavioral: The patient is nervous/anxious.   All other systems reviewed and are negative.   Physical Exam Updated Vital Signs BP (!) 151/90   Pulse (!) 118   Temp 98.2 F (36.8 C) (Oral)   Resp 12   SpO2 98%   Physical Exam Vitals and nursing note reviewed. Exam conducted with a chaperone present.  Constitutional:      General: She is in acute distress.     Appearance: She is well-developed and well-nourished.  HENT:     Head: Normocephalic.  Eyes:     General: No scleral icterus.    Conjunctiva/sclera: Conjunctivae normal.  Cardiovascular:  Rate and Rhythm: Tachycardia present.     Pulses: Intact distal pulses.  Pulmonary:     Effort: Pulmonary effort is normal.  Abdominal:     Comments: Gravid uterus.  Palpable contractions every 2-3 minutes.  Fetal heart rate 125-150 and variable.  Genitourinary:    Exam position: Supine.     Comments: Small amount of vaginal bleeding noted.  No crowning. Musculoskeletal:        General: Normal range of motion.     Cervical back: Normal range of motion.  Skin:    General: Skin is warm and dry.     Capillary Refill: Capillary refill takes less than 2 seconds.  Neurological:     Mental Status: She is alert.  Psychiatric:        Mood and Affect: Mood is anxious.     ED Results / Procedures / Treatments    Procedures Procedures   Medications Ordered in ED Medications  ePHEDrine injection 10 mg (has no administration in time range)  PHENYLephrine 40 mcg/ml in normal saline Adult  IV Push Syringe (For Blood Pressure Support) (has no administration in time range)  lactated ringers infusion 500 mL (has no administration in time range)  fentaNYL 2 mcg/mL w/ bupivacaine 0.125% in NS 250 mL epidural infusion ([facility]-ANES) (has no administration in time range)  diphenhydrAMINE (BENADRYL) injection 12.5 mg (has no administration in time range)  ePHEDrine injection 10 mg (has no administration in time range)  PHENYLephrine 40 mcg/ml in normal saline Adult IV Push Syringe (For Blood Pressure Support) (has no administration in time range)  fentaNYL (SUBLIMAZE) 100 MCG/2ML injection (50 mcg  Given 07/25/20 2317)  lactated ringers bolus 1,000 mL (1,000 mLs Intravenous New Bag/Given 07/25/20 2319)    ED Course  I have reviewed the triage vital signs and the nursing notes.  Pertinent labs & imaging results that were available during my care of the patient were reviewed by me and considered in my medical decision making (see chart for details).  Clinical Course as of 07/25/20 2341  Tue Jul 25, 2020  2337 BP(!): 141/103 Noted.  Patient actively contracting. [HM]    Clinical Course User Index [HM] Hyder Deman, Boyd Kerbs   MDM Rules/Calculators/A&P                          Patient initially evaluated in triage for possible transfer to MAU however given patient distress and frequency of contractions felt it safest to do more thorough exam prior to transfer.  Patient roomed and rapid OB called.  Patient actively contracting, small amount of blood at the introitus.  No active crowning.  Placed on toco monitor with measured contractions.  Fetal heart rate variable in the 150s.  Patient tachycardic and hypertensive during contractions.  Denies vision changes, seizures at home.  Denies history of hypertension, preeclampsia or eclampsia.  Rapid OB - cervical check: patient found to be 4-5 cm.  Safe to moved MAU.  The patient was discussed with and seen by Dr. Clayborne Dana who agrees with the  treatment plan.   Final Clinical Impression(s) / ED Diagnoses Final diagnoses:  Labor without complication    Rx / DC Orders ED Discharge Orders    None       [place] Kuang, Boyd Kerbs 07/25/20 2342    Mesner, Barbara Cower, MD 07/26/20 [phone]

## 2020-07-25 NOTE — H&P (Addendum)
OBSTETRIC ADMISSION HISTORY AND PHYSICAL  Lisa Preston is a 25 y.o. female [phone] with IUP at [redacted]w[redacted]d by LMP presenting for SOL. She reports +FMs, No LOF, no VB, no blurry vision, headaches or peripheral edema, and RUQ pain.  She plans on breast and bottle feeding. She requests patch for birth control. She received her prenatal care at [facility].   Dating: By LMP --->  Estimated Date of Delivery: None noted.  Sono:   Unable to see ultrasound results    Prenatal History/Complications:  Bipolar disorder Anxiety & depression  Obesity   Past Medical History: Past Medical History:  Diagnosis Date   ADHD    Asthma    Bipolar disorder (HCC)    Depression    GERD (gastroesophageal reflux disease)    Migraine     Past Surgical History: Past Surgical History:  Procedure Laterality Date   TONSILLECTOMY     WISDOM TOOTH EXTRACTION      Obstetrical History: OB History     Gravida  12   Para  2   Term  2   Preterm      AB  9   Living  1      SAB  9   IAB      Ectopic      Multiple      Live Births  1           Social History Social History   Socioeconomic History   Marital status: Single    Spouse name: Not on file   Number of children: Not on file   Years of education: Not on file   Highest education level: Not on file  Occupational History   Not on file  Tobacco Use   Smoking status: Current Every Day Smoker    Packs/day: 0.50    Types: Cigarettes   Smokeless tobacco: Never Used  Vaping Use   Vaping Use: Never used  Substance and Sexual Activity   Alcohol use: Never   Drug use: Never   Sexual activity: Yes  Other Topics Concern   Not on file  Social History Narrative   Not on file   Social Determinants of Health   Financial Resource Strain: Not on file  Food Insecurity: Not on file  Transportation Needs: Not on file  Physical Activity: Not on file  Stress: Not on file  Social Connections: Not on file    Family History: No  family history on file.  Allergies: Allergies  Allergen Reactions   Fluconazole Rash    Pt denies allergies to latex, iodine, or shellfish.  Medications Prior to Admission  Medication Sig Dispense Refill Last Dose   metroNIDAZOLE (FLAGYL) 500 MG tablet Take 1 tablet (500 mg total) by mouth 2 (two) times daily. 14 tablet 0    Prenatal Vit-Fe Fumarate-FA (MULTIVITAMIN-PRENATAL) 27-0.8 MG TABS tablet Take 1 tablet by mouth daily at 12 noon.        Review of Systems   All systems reviewed and negative except as stated in HPI  Blood pressure (!) 151/90, pulse (!) 118, temperature 98.2 F (36.8 C), temperature source Oral, resp. rate 12, SpO2 98 %, unknown if currently breastfeeding. General appearance: alert, cooperative and moderate distress Patient very uncomfortable Lungs: normal WOB Heart: regular rate  Extremities: No sign of DVT Presentation: cephalic Fetal monitoringBaseline: 120  bpm, Variability: Good {> 6 bpm), Accelerations: Reactive and Decelerations: Absent Uterine activityFrequency: Every 2-3 minutes Dilation: 8 Station: 0 Exam by:: Dr. Idalia Needle  Prenatal labs: ABO, Rh:   A pos Antibody:   negative  Rubella:  1.44  immune  RPR:   non reactive  HBsAg:   negative HIV:  non reactive  GBS:   negative  2 hr Glucola: 114 Genetic screening:  unable to see results  Anatomy US: unable to see results   Prenatal Transfer Tool  Maternal Diabetes: No Genetic Screening: Unsure Maternal Ultrasounds/Referrals: Other:unsure Fetal Ultrasounds or other Referrals:  None Maternal Substance Abuse:  No Significant Maternal Medications:  None Significant Maternal Lab Results: None  No results found for this or any previous visit (from the past 24 hour(s)).  Patient Active Problem List   Diagnosis Date Noted   Indication for care in labor or delivery 07/25/2020    Assessment/Plan:  Lisa Preston is a 25 y.o. [id number] at [redacted]w[redacted]d here for SOL  #Labor: Presented from ED  complaining of labor. Was checked and was 4-5cm. When patient arrived to the floor she was 7-8cm. Membranes in tact. Will continue with expectant management  #Pain: Desires epidural  #FWB: Cat 1 strip  #ID: GBS negative  #MOF: breast and bottle  #MOC: patch  #Circ:  No  #Bipolar, anxiety & depression: No meds. SW consult  #Obesity: BMI 41.95  Cora Collum, DO  [facility] [employer], [facility] 07/25/2020, 11:59 PM  I spoke with and examined patient and agree with resident/PA-S/MS/SNM's note and plan of care. Pt w/ contractions all week, worse x 1hr, didn't feel she could make it to [facility] as she has h/o rapid labor. Reports uncomplicated pregnancy, GBS neg.  Cheral Marker, CNM, [facility] 07/26/2020 7:21 AM

## 2020-07-25 NOTE — ED Notes (Signed)
 fentanyl IV given per verbal order Dr. Clayborne Dana.

## 2020-07-25 NOTE — ED Triage Notes (Signed)
Pt is 38 weeks, G3P2, contractions for the past hour, lost mucous plug, no rupture of membranes

## 2020-07-26 ENCOUNTER — Encounter (HOSPITAL_COMMUNITY): Payer: Self-pay | Admitting: Obstetrics and Gynecology

## 2020-07-26 ENCOUNTER — Other Ambulatory Visit: Payer: Self-pay

## 2020-07-26 DIAGNOSIS — O4202 Full-term premature rupture of membranes, onset of labor within 24 hours of rupture: Secondary | ICD-10-CM

## 2020-07-26 DIAGNOSIS — Z3A38 38 weeks gestation of pregnancy: Secondary | ICD-10-CM

## 2020-07-26 LAB — RESP PANEL BY RT-PCR (FLU A&B, COVID) ARPGX2
Influenza A by PCR: NEGATIVE
Influenza B by PCR: NEGATIVE
SARS Coronavirus 2 by RT PCR: NEGATIVE

## 2020-07-26 LAB — CBC
HCT: 48 % — ABNORMAL HIGH (ref 36.0–46.0)
Hemoglobin: 15.8 g/dL — ABNORMAL HIGH (ref 12.0–15.0)
MCH: 31 pg (ref 26.0–34.0)
MCHC: 32.9 g/dL (ref 30.0–36.0)
MCV: 94.3 fL (ref 80.0–100.0)
Platelets: 158 10*3/uL (ref 150–400)
RBC: 5.09 MIL/uL (ref 3.87–5.11)
RDW: 13.5 % (ref 11.5–15.5)
WBC: 12 10*3/uL — ABNORMAL HIGH (ref 4.0–10.5)
nRBC: 0 % (ref 0.0–0.2)

## 2020-07-26 LAB — TYPE AND SCREEN
ABO/RH(D): A POS
Antibody Screen: NEGATIVE

## 2020-07-26 LAB — RPR: RPR Ser Ql: NONREACTIVE

## 2020-07-26 MED ORDER — METRONIDAZOLE 500 MG PO TABS
500.0000 mg | ORAL_TABLET | Freq: Two times a day (BID) | ORAL | Status: DC
  Administered 2020-07-26 – 2020-07-27 (×4): 500 mg via ORAL
  Filled 2020-07-26 (×4): qty 1

## 2020-07-26 MED ORDER — SENNOSIDES-DOCUSATE SODIUM 8.6-50 MG PO TABS
2.0000 | ORAL_TABLET | Freq: Every day | ORAL | Status: DC
  Filled 2020-07-26: qty 2

## 2020-07-26 MED ORDER — ONDANSETRON HCL 4 MG/2ML IJ SOLN: 4.0000 mg | INTRAMUSCULAR | Status: DC | PRN

## 2020-07-26 MED ORDER — ACETAMINOPHEN 325 MG PO TABS
650.0000 mg | ORAL_TABLET | ORAL | Status: DC
  Administered 2020-07-26 – 2020-07-27 (×8): 650 mg via ORAL
  Filled 2020-07-26 (×8): qty 2

## 2020-07-26 MED ORDER — COCONUT OIL OIL: 1.0000 "application " | TOPICAL_OIL | Status: DC | PRN

## 2020-07-26 MED ORDER — DIPHENHYDRAMINE HCL 25 MG PO CAPS: 25.0000 mg | ORAL_CAPSULE | Freq: Four times a day (QID) | ORAL | Status: DC | PRN

## 2020-07-26 MED ORDER — IBUPROFEN 600 MG PO TABS
600.0000 mg | ORAL_TABLET | Freq: Four times a day (QID) | ORAL | Status: DC
  Administered 2020-07-26 – 2020-07-27 (×5): 600 mg via ORAL
  Filled 2020-07-26 (×5): qty 1

## 2020-07-26 MED ORDER — SIMETHICONE 80 MG PO CHEW: 80.0000 mg | CHEWABLE_TABLET | ORAL | Status: DC | PRN

## 2020-07-26 MED ORDER — PRENATAL MULTIVITAMIN CH
1.0000 | ORAL_TABLET | Freq: Every day | ORAL | Status: DC
  Administered 2020-07-26 – 2020-07-27 (×2): 1 via ORAL
  Filled 2020-07-26 (×2): qty 1

## 2020-07-26 MED ORDER — BENZOCAINE-MENTHOL 20-0.5 % EX AERO: 1.0000 "application " | INHALATION_SPRAY | CUTANEOUS | Status: DC | PRN

## 2020-07-26 MED ORDER — WITCH HAZEL-GLYCERIN EX PADS: 1.0000 "application " | MEDICATED_PAD | CUTANEOUS | Status: DC | PRN

## 2020-07-26 MED ORDER — PRENATAL 27-0.8 MG PO TABS: 1.0000 | ORAL_TABLET | Freq: Every day | ORAL | Status: DC

## 2020-07-26 MED ORDER — FENTANYL CITRATE (PF) 100 MCG/2ML IJ SOLN
INTRAMUSCULAR | Status: AC
  Filled 2020-07-26: qty 2

## 2020-07-26 MED ORDER — ONDANSETRON HCL 4 MG PO TABS
4.0000 mg | ORAL_TABLET | ORAL | Status: DC | PRN
Start: 2020-07-26 — End: 2020-07-27

## 2020-07-26 MED ORDER — DIBUCAINE (PERIANAL) 1 % EX OINT: 1.0000 "application " | TOPICAL_OINTMENT | CUTANEOUS | Status: DC | PRN

## 2020-07-26 MED ORDER — TETANUS-DIPHTH-ACELL PERTUSSIS 5-2.5-18.5 LF-MCG/0.5 IM SUSY: 0.5000 mL | PREFILLED_SYRINGE | Freq: Once | INTRAMUSCULAR | Status: DC

## 2020-07-26 MED ORDER — SENNOSIDES-DOCUSATE SODIUM 8.6-50 MG PO TABS
2.0000 | ORAL_TABLET | Freq: Every day | ORAL | Status: DC
  Administered 2020-07-26 – 2020-07-27 (×2): 2 via ORAL
  Filled 2020-07-26: qty 2

## 2020-07-26 NOTE — Discharge Summary (Signed)
Postpartum Discharge Summary    Patient Name: Lisa Preston DOB: [birth year] MRN: [id number]  Date of admission: 07/25/2020 Delivery date:07/26/2020  Delivering provider: Shary Key  Date of discharge: 07/27/2020  Admitting diagnosis: Indication for care in labor or delivery [O75.9] Labor without complication [S17] Intrauterine pregnancy: Unknown     Secondary diagnosis:  Active Problems:   Indication for care in labor or delivery  Additional problems: rapid labor    Discharge diagnosis: Term Pregnancy Delivered                                              Post partum procedures:none Augmentation: none Complications: None  Hospital course: Onset of Labor With Vaginal Delivery      25 y.o. yo [id number] at Unknown was admitted in Active Labor on 07/25/2020. Patient had an uncomplicated labor course as follows:  Membrane Rupture Time/Date: 12:24 AM ,07/26/2020   Delivery Method:Vaginal, Spontaneous  Episiotomy: None  Lacerations:  None  Patient had an uncomplicated postpartum course.  She is ambulating, tolerating a regular diet, passing flatus, and urinating well. Patient is discharged home in stable condition on 07/27/20.  Newborn Data: Birth date:07/26/2020  Birth time:12:27 AM  Gender:Female  Living status:Living  Apgars:8 ,9  Weight:3371 g   Magnesium Sulfate received: No BMZ received: No Rhophylac:N/A MMR:N/A T-DaP:Given prenatally Flu: No Transfusion:No  Physical exam  Vitals:   07/26/20 1400 07/26/20 1750 07/26/20 2100 07/27/20 0521  BP: 130/73 132/72 (!) 113/57 120/70  Pulse: 83 81 82 70  Resp: 20 20 20 18   Temp: 97.9 F (36.6 C) 98.1 F (36.7 C) 98.1 F (36.7 C) 97.6 F (36.4 C)  TempSrc: Oral Oral Oral Oral  SpO2:      Weight:      Height:       General: alert, cooperative and no distress Lochia: appropriate Uterine Fundus: firm Incision: N/A DVT Evaluation: No evidence of DVT seen on physical exam. Labs: Lab Results  Component Value Date    WBC 12.0 (H) 07/25/2020   HGB 15.8 (H) 07/25/2020   HCT 48.0 (H) 07/25/2020   MCV 94.3 07/25/2020   PLT 158 07/25/2020   CMP Latest Ref Rng & Units 01/28/2018  Glucose 70 - 99 mg/dL 115(H)  BUN 6 - 20 mg/dL 9  Creatinine 0.44 - 1.00 mg/dL 0.75  Sodium 135 - 145 mmol/L 138  Potassium 3.5 - 5.1 mmol/L 3.8  Chloride 98 - 111 mmol/L 108  CO2 22 - 32 mmol/L 24  Calcium 8.9 - 10.3 mg/dL 9.0  Total Protein 6.5 - 8.1 g/dL 6.8  Total Bilirubin 0.3 - 1.2 mg/dL 0.6  Alkaline Phos 38 - 126 U/L 62  AST 15 - 41 U/L 18  ALT 0 - 44 U/L 17   Edinburgh Score: Edinburgh Postnatal Depression Scale Screening Tool 07/26/2020  I have been able to laugh and see the funny side of things. (No Data)     After visit meds:  Allergies as of 07/27/2020      Reactions   Fluconazole Rash      Medication List    TAKE these medications   acetaminophen 500 MG tablet Commonly known as: TYLENOL Take 1,000 mg by mouth every 6 (six) hours as needed for mild pain or headache.   albuterol 108 (90 Base) MCG/ACT inhaler Commonly known as: VENTOLIN HFA Inhale 2 puffs  into the lungs every 6 (six) hours as needed for wheezing or shortness of breath.   benzocaine-Menthol 20-0.5 % Aero Commonly known as: DERMOPLAST Apply 1 application topically as needed for irritation (perineal discomfort).   coconut oil Oil Apply 1 application topically as needed.   esomeprazole 40 MG capsule Commonly known as: NEXIUM Take 40 mg by mouth daily at 12 noon.   ibuprofen 600 MG tablet Commonly known as: ADVIL Take 1 tablet (600 mg total) by mouth every 6 (six) hours.   levothyroxine 75 MCG tablet Commonly known as: SYNTHROID Take 75 mcg by mouth daily.   PrePLUS 27-1 MG Tabs Take 1 tablet by mouth daily.        Discharge home in stable condition Infant Feeding: Bottle and Breast Infant Disposition:home with mother Discharge instruction: per After Visit Summary and Postpartum booklet. Activity: Advance as  tolerated. Pelvic rest for 6 weeks.  Diet: routine diet Future Appointments:No future appointments.  Follow up Visit: Discussed with patient. Plans for follow up with OB at [facility]. Will discuss birth control options further.   07/27/2020 Janet Berlin, MD

## 2020-07-26 NOTE — Progress Notes (Signed)
2316: OBRRN called to [facility] for pt third baby, contracting, given fentanyl.   2322: OBRRN at bedside. Pt states she's due 2/19, third baby, ctx got stronger an hour ago. SVE 4.5cm/90/0. Gets care at [facility]. Hx of precip delivery. No LOF, bloody show, feeling baby move.   2325: Dr. Jolayne Panther called and notified of pt third baby, 38 wks, care at [facility]. 4.5cm/90/0. GBS negative. No issues w/current or previous pregnancies, hx of precip deliveries.Orders to transfer to L&D.   2328: Pt removed from monitor for transfer to L&D.

## 2020-07-26 NOTE — Lactation Note (Signed)
This note was copied from a baby's chart.  Lactation Consultation Note  Patient Name: Lisa Preston TDVVO'H Date: 07/26/2020 Reason for consult: Initial assessment;Early term 37-38.6wks (Transfer from NICU) Age:26 hours Per mom, infant is latching well, most feedings are 10 minutes in length. LC suggested mom BF infant STS and do breast compressions to keep infant awake while nursing. Mom latched infant on her right breast using the cradle hold, infant latched with depth, LC gave mom pillows for breast and infant  support, infant BF for 10 minutes. Mom plans to BF infant according to cues, 8 to 12+ times, STS and then supplement infant with her EBM after feedings. Mom brought her personal DEBP from home and pumped 15 mls of colostrum that she gave to infant less than 3 hours earlier. Mom is working on Building control surveyor with depth at the breast. Mom feeding choice is breast and formula, mom plans to stop formula for now,and focus on  latching infant at breast and supplement him with her EBM only. Mom has breastfeeding supplemental sheet based on infant's age/ hours of life. Mom knows to call RN or LC if she needs any assistance with latching infant at the breast.  Mom made aware of O/P services, breastfeeding support groups, community resources, and our phone # for post-discharge questions.  Maternal Data Has patient been taught Hand Expression?: Yes Does the patient have breastfeeding experience prior to this delivery?: Yes How long did the patient breastfeed?: Per mom, she BF her 2nd child for 2 weeks who is now 1 year.  Feeding Mother's Current Feeding Choice: Breast Milk and Formula Nipple Type: Nfant Slow Flow (purple)  LATCH Score Latch: Grasps breast easily, tongue down, lips flanged, rhythmical sucking.  Audible Swallowing: Spontaneous and intermittent  Type of Nipple: Everted at rest and after stimulation  Comfort (Breast/Nipple): Soft / non-tender  Hold (Positioning):  Assistance needed to correctly position infant at breast and maintain latch.  LATCH Score: 9   Lactation Tools Discussed/Used    Interventions Interventions: Breast feeding basics reviewed;Assisted with latch;Skin to skin;Breast massage;Hand express;Breast compression;Adjust position;Support pillows;Position options;Expressed milk  Discharge Pump: Personal WIC Program: Yes  Consult Status Consult Status: Follow-up Date: 07/27/20 Follow-up type: In-patient    Danelle Earthly 07/26/2020, 10:27 PM

## 2020-07-27 DIAGNOSIS — Z9889 Other specified postprocedural states: Secondary | ICD-10-CM

## 2020-07-27 MED ORDER — PREPLUS 27-1 MG PO TABS: 1.0000 | ORAL_TABLET | Freq: Every day | ORAL | 13 refills | Status: AC

## 2020-07-27 MED ORDER — BENZOCAINE-MENTHOL 20-0.5 % EX AERO: 1.0000 "application " | INHALATION_SPRAY | CUTANEOUS | 0 refills | Status: AC | PRN

## 2020-07-27 MED ORDER — IBUPROFEN 600 MG PO TABS: 600.0000 mg | ORAL_TABLET | Freq: Four times a day (QID) | ORAL | 0 refills | Status: AC

## 2020-07-27 MED ORDER — COCONUT OIL OIL: 1.0000 "application " | TOPICAL_OIL | 0 refills | Status: AC | PRN

## 2020-07-27 NOTE — Clinical Social Work Maternal (Signed)
CLINICAL SOCIAL WORK MATERNAL/CHILD NOTE  Patient Details  Name: Lisa Preston MRN: [id number] Date of Birth: Nov 14, 1995  Date:  07/27/2020  Clinical Social Worker Initiating Note:  Hortencia Pilar, LCSW Date/Time: Initiated:  07/27/20/1000     Child's Name:  Horald Chestnut   Biological Parents:  Mother,Father Jaymes Graff, [place] Rae Mar)   Need for Interpreter:  None   Reason for Referral:  Behavioral Health Concerns   Address:  [address] [place] [address]    Phone number:  [phone] (home)     Additional phone number: none   Household Members/Support Persons (HM/SP):   Household Member/Support Person 1,Household Member/Support Person 2   HM/SP Name Relationship DOB or Age  HM/SP -1  Verle Brillhart [facility] 12/[birth year]/1994  HM/SP -2 Jose Padierna FOB  11/26/1982  HM/SP -3 Denyse Amass Padierna-Lones son 08/30/2014  HM/SP -4 Angel Parierna-Lones son 07/19/19  HM/SP -5 Joann Lones MGM    HM/SP -6        HM/SP -7        HM/SP -8          Natural Supports (not living in the home):  Parent   Professional Supports: None   Employment: Homemaker   Type of Work: 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   Education:  High school graduate   Homebound arranged:  n/a  Financial Resources:  Medicaid   Other Resources:  WIC,Food Stamps    Cultural/Religious Considerations Which May Impact Care:  none reported.   Strengths:  Home prepared for child ,Compliance with medical plan ,Ability to meet basic needs ,Pediatrician chosen   Psychotropic Medications:       MOB reported that she takes medications but none are for her mental health.   Pediatrician:    [place] area  Pediatrician List:   [facility] ([address])  [facility]      Pediatrician Fax Number:    Risk Factors/Current Problems:  None   Cognitive State:   Alert ,Able to Concentrate ,Insightful    Mood/Affect:  Calm ,Interested ,Happy ,Relaxed ,Comfortable    CSW Assessment: CSW consulted due to MOB having a hx of Bipolar, anxiety and depression. CSW went to speak with MOB at bedside.   congratulated MOB on the birth of infant. CSW advised MOB of CSW's role and the reason for CSW coming to speak with her. MOB expressed that she was diagnosed with Bipolar age 25. MOB also reported that she was dealign with anxiety and depression around the same time. MOB expressed that she had mediation use in the past however declined that she is on any medications or desire for medication. MOB expressed that she has no other mental health hx and denies feelings of SI. HI and DV to this CSW when asked. MOB expressed that she has been feeling well since the birth of infant, however MOB reported that this pregnancy was not planned. MOB expressed recently having a child in February 2021 and reported that this infant just turned 1. MOB chuckled in speaking with CSW about having children close together.   MOB expressed that she is from home with FOB, MGM and her other children. MOB reported that she gets [facility] and [employer] and that she has all needed items to care for infant with plans for infant  to sleep in basinet once arrived home. MOB expressed no other needs.   CSW provided MOB with PPD education as MOB reported a hx of PPD with her first son. MOB was given PPD Checklist in order to keep track of feelings as they may relate to PPD.   CSW Plan/Description:  No Further Intervention Required/No Barriers to Discharge,Sudden Infant Death Syndrome (SIDS) Education,Perinatal Mood and Anxiety Disorder (PMADs) Education    Robb Matar, LCSWA 07/27/2020, 10:58 AM

## 2020-07-27 NOTE — Lactation Note (Signed)
This note was copied from a baby's chart. Lactation Consultation Note  Patient Name: Boy Danessa Mensch RXYVO'P Date: 07/27/2020 Reason for consult: Follow-up assessment;Early term 37-38.6wks Age:26 hours  0925 - 0946 - I followed up with Ms. Schlotterbeck and conducted discharge education. I observed her latch baby Elita Quick to the right breast in cradle cold. Rhythmic suckling sequences observed.  Ms. Armes left nipple was bruised/purple. She has been using a Mom Cozy breast pump and pumped 15 mls (per mom) which she bottle fed to baby.  I recommended that she breast feed Jose on demand 8-12 times a day and offer both breasts in a feeding. I discouraged the use of artificial nipples or pacifiers until breast feeding was well established. We discussed risks of early introduction of artificial nipples.  Ms. Rothman discussed her history of breast feeding and early cessation of breast feeding due to low milk supply. We discussed ways to encouraged more milk volume through breast feeding on demand and limiting  Early use of formula (unless medically indicated).  Ms. Poteet will follow up with [facility]. Discharge education conducted, and I reviewed our community breast feeding resources.   Maternal Data Has patient been taught Hand Expression?: Yes Does the patient have breastfeeding experience prior to this delivery?: Yes How long did the patient breastfeed?: 2 weeks  Feeding Mother's Current Feeding Choice: Breast Milk  LATCH Score Latch: Grasps breast easily, tongue down, lips flanged, rhythmical sucking.  Audible Swallowing: A few with stimulation  Type of Nipple: Everted at rest and after stimulation  Comfort (Breast/Nipple): Soft / non-tender  Hold (Positioning): No assistance needed to correctly position infant at breast.  LATCH Score: 9   Lactation Tools Discussed/Used    Interventions Interventions: Breast feeding basics reviewed;Hand express;Education  Discharge Discharge  Education: Engorgement and breast care;Warning signs for feeding baby Pump: Personal (Mom Cozy)  Consult Status Consult Status: Complete Date: 07/27/20 Follow-up type: Call as needed    Walker Shadow 07/27/2020, 9:55 AM
